# Patient Record
Sex: Female | Born: 1955
Health system: Southern US, Community
[De-identification: ages and names within clinical notes are randomized; demographics above are authoritative.]

## PROBLEM LIST (undated history)

## (undated) DIAGNOSIS — S82899A Other fracture of unspecified lower leg, initial encounter for closed fracture: Secondary | ICD-10-CM

## (undated) DIAGNOSIS — E782 Mixed hyperlipidemia: Secondary | ICD-10-CM

## (undated) DIAGNOSIS — M1990 Unspecified osteoarthritis, unspecified site: Secondary | ICD-10-CM

## (undated) DIAGNOSIS — C801 Malignant (primary) neoplasm, unspecified: Secondary | ICD-10-CM

## (undated) DIAGNOSIS — M7072 Other bursitis of hip, left hip: Secondary | ICD-10-CM

## (undated) DIAGNOSIS — E039 Hypothyroidism, unspecified: Secondary | ICD-10-CM

## (undated) DIAGNOSIS — N390 Urinary tract infection, site not specified: Secondary | ICD-10-CM

## (undated) DIAGNOSIS — S62132A Displaced fracture of capitate [os magnum] bone, left wrist, initial encounter for closed fracture: Secondary | ICD-10-CM

## (undated) DIAGNOSIS — IMO0002 Reserved for concepts with insufficient information to code with codable children: Secondary | ICD-10-CM

## (undated) DIAGNOSIS — R7303 Prediabetes: Secondary | ICD-10-CM

## (undated) DIAGNOSIS — I10 Essential (primary) hypertension: Secondary | ICD-10-CM

## (undated) DIAGNOSIS — Z87442 Personal history of urinary calculi: Secondary | ICD-10-CM

## (undated) DIAGNOSIS — K219 Gastro-esophageal reflux disease without esophagitis: Secondary | ICD-10-CM

## (undated) DIAGNOSIS — L03313 Cellulitis of chest wall: Secondary | ICD-10-CM

## (undated) DIAGNOSIS — M5090 Cervical disc disorder, unspecified, unspecified cervical region: Secondary | ICD-10-CM

## (undated) HISTORY — PX: BACK SURGERY: SHX140

## (undated) HISTORY — PX: TUBAL LIGATION: SHX77

## (undated) HISTORY — PX: GYNECOLOGIC CRYOSURGERY: SHX857

## (undated) HISTORY — PX: OTHER SURGICAL HISTORY: SHX169

---

## 2002-04-18 ENCOUNTER — Emergency Department (HOSPITAL_COMMUNITY): Admission: EM | Admit: 2002-04-18 | Discharge: 2002-04-18 | Payer: Self-pay | Admitting: *Deleted

## 2002-04-19 ENCOUNTER — Ambulatory Visit (HOSPITAL_COMMUNITY): Admission: RE | Admit: 2002-04-19 | Discharge: 2002-04-19 | Payer: Self-pay | Admitting: *Deleted

## 2002-04-19 ENCOUNTER — Encounter: Payer: Self-pay | Admitting: *Deleted

## 2002-12-30 ENCOUNTER — Emergency Department (HOSPITAL_COMMUNITY): Admission: AD | Admit: 2002-12-30 | Discharge: 2002-12-30 | Payer: Self-pay | Admitting: Emergency Medicine

## 2002-12-30 ENCOUNTER — Encounter: Payer: Self-pay | Admitting: Emergency Medicine

## 2003-09-25 HISTORY — PX: LUMBAR FUSION: SHX111

## 2004-09-24 HISTORY — PX: COLONOSCOPY: SHX174

## 2004-11-30 ENCOUNTER — Ambulatory Visit: Payer: Self-pay | Admitting: Internal Medicine

## 2005-05-25 ENCOUNTER — Inpatient Hospital Stay (HOSPITAL_COMMUNITY): Admission: RE | Admit: 2005-05-25 | Discharge: 2005-05-29 | Payer: Self-pay | Admitting: Orthopaedic Surgery

## 2005-09-24 HISTORY — PX: LUMBAR FUSION: SHX111

## 2005-11-04 ENCOUNTER — Encounter: Admission: RE | Admit: 2005-11-04 | Discharge: 2005-11-04 | Payer: Self-pay | Admitting: Orthopaedic Surgery

## 2005-11-26 ENCOUNTER — Encounter: Admission: RE | Admit: 2005-11-26 | Discharge: 2005-11-26 | Payer: Self-pay | Admitting: Orthopaedic Surgery

## 2006-01-28 ENCOUNTER — Encounter: Admission: RE | Admit: 2006-01-28 | Discharge: 2006-01-28 | Payer: Self-pay | Admitting: Orthopaedic Surgery

## 2006-03-12 ENCOUNTER — Ambulatory Visit (HOSPITAL_BASED_OUTPATIENT_CLINIC_OR_DEPARTMENT_OTHER): Admission: RE | Admit: 2006-03-12 | Discharge: 2006-03-12 | Payer: Self-pay | Admitting: Orthopaedic Surgery

## 2006-10-03 ENCOUNTER — Encounter: Admission: RE | Admit: 2006-10-03 | Discharge: 2006-10-03 | Payer: Self-pay | Admitting: Orthopaedic Surgery

## 2006-11-29 ENCOUNTER — Encounter: Admission: RE | Admit: 2006-11-29 | Discharge: 2006-11-29 | Payer: Self-pay | Admitting: Orthopaedic Surgery

## 2006-12-11 ENCOUNTER — Inpatient Hospital Stay (HOSPITAL_COMMUNITY): Admission: RE | Admit: 2006-12-11 | Discharge: 2006-12-14 | Payer: Self-pay | Admitting: Orthopaedic Surgery

## 2008-10-20 ENCOUNTER — Encounter: Admission: RE | Admit: 2008-10-20 | Discharge: 2008-10-20 | Payer: Self-pay | Admitting: Orthopaedic Surgery

## 2009-10-30 ENCOUNTER — Emergency Department: Payer: Self-pay | Admitting: Internal Medicine

## 2009-11-07 ENCOUNTER — Ambulatory Visit: Payer: Self-pay | Admitting: Urology

## 2010-08-24 HISTORY — PX: KNEE ARTHROSCOPY: SHX127

## 2011-02-09 NOTE — Discharge Summary (Signed)
NAMEKRYSTAN, NORTHROP              ACCOUNT NO.:  [id number]   MEDICAL RECORD NO.:  [id number]          PATIENT TYPE:  INP   LOCATION:  5018                         FACILITY:  [facility]   PHYSICIAN:  Sharolyn Douglas, M.D.        DATE OF BIRTH:  [birth year]   DATE OF ADMISSION:  12/11/2006  DATE OF DISCHARGE:  12/14/2006                               DISCHARGE SUMMARY   ADMITTING DIAGNOSIS:  1. Adjacent segment disease at L5-1 with severe back pain.  2. Previous posterior spinal fusion.   DISCHARGE DIAGNOSIS:  1. Status post revision posterior spinal fusion L5-S1.  2. Postoperative blood loss anemia.  3. Postoperative hyperglycemia.  4. Urinary tract infection.   PROCEDURE:  On 12/11/06 the patient was admitted and taken to the  operating room for removal of hardware and exploration of fusion L4-5  and posterior spinal fusion L5-S1 with pedicle screws  TLIF.  Surgeon  was Sharolyn Douglas, M.D.  Stage manager Mahar PAC.  Anesthesia was  general.   CONSULTATION:  None.   LABS:  Preop CBC with diff was normal.  Postoperatively H&H were  monitored x3 days and reached 11.6 and 33.4 on 12/14/06.  Coags were  normal preop.  Complete metabolic panel preop was normal with the  exception of ALT of 47.  Basic metabolic panel monitored x2 days, showed  a glucose of 117 and 123 on postop day one and two respectively.  BUN of  4 and 2 on postoperative day one and two.  UA from 03/17 showed positive  nitrites, few epithelials, 3 to 6 WBC's and many bacteria.  E. coli grew  out on the cultures.  EKG from March 17 shows normal sinus rhythm, no  change since last tracing read by Jaclyn Prime. Lucas Mallow, M.D.  X-rays from  03/22 showed stable postoperative changes from L-5 to S-1.  On 03/19 x-  ray was used intraoperatively for localization and screw placement.   BRIEF HISTORY:  The patient is a 55 year old female who has been a  patient of Dr. Noel Gerold for several years now.  She has undergone a  previous L4-5  posterior spinal fusion and did very well for a short  period time.  Unfortunately she developed adjacent segment disease and  pain related to the L5-S1 segment.  Because of her failure to improve  with conservative treatment and her increasing pain, Dr. Noel Gerold felt the  best course of management would be a revision posterior spinal fusion,  evaluate the fusion L4-5, remove the hardware and then fuse L5-S1.  Risks and benefits of the procedure were discussed with the patient by  Dr. Noel Gerold.  She indicated understanding and opted to proceed.   HOSPITAL COURSE:  On 12/11/06 the patient was admitted to the hospital  and taken to the operating room for the above-listed procedure.  She  tolerated procedure well without any intraoperative complications.  She  was transferred to recovery room in stable condition.   Routine orthopedic spine protocol was followed postoperatively and she  progressed along very well.  She did not develop any significant medical  or  orthopedic complications postoperatively.   Physical therapy and occupational therapy worked with the patient on a  daily basis on progressive ambulation program.  They also worked with  her on a brace use and back precautions.  She progressed along very well  with them and got to the point that she was independent and safe prior  to her discharge.   The patient was noted to have a urinary tract infection on her  preoperative UA.  She was started on Cipro and actually discharged on  Cipro from the hospital.   By 12/14/06 the patient had met all orthopedic goals.  She was medically  stable and ready for discharge home.   DISCHARGE/PLAN:  Patient is a 55 year old female status post revision  posterior spinal fusion doing well.   ACTIVITIES:  Daily ambulation program, brace on when she is up, back  precautions at all times, no lifting greater than 5 pounds, the patient  may shower.   FOLLOW UP:  Follow-up 2 weeks postoperatively with  Dr. Noel Gerold.   DIET:  Regular home diet as tolerated.   MEDICATIONS ON DISCHARGE:  Vicodin or Tramadol for pain, Skelaxin for  muscle spasm, Colace twice daily, laxative as needed, multivitamin  daily, calcium daily, avoid NSAIDs.   DISPOSITION:  The patient is being discharged to her home with her  family assistance as well as home health physical therapy and  occupational therapy.   CONDITION ON DISCHARGE:  Stable, improved.      Verlin Fester, P.A.      Sharolyn Douglas, M.D.  Electronically Signed    CM/MEDQ  D:  02/20/2007  T:  02/20/2007  Job:  [id number]   cc:   Sharolyn Douglas, M.D.

## 2011-02-09 NOTE — Discharge Summary (Signed)
NAMEDABNEY, DEVER              ACCOUNT NO.:  [id number]   MEDICAL RECORD NO.:  [id number]          PATIENT TYPE:  INP   LOCATION:  5018                         FACILITY:  [facility]   PHYSICIAN:  Sharolyn Douglas, M.D.        DATE OF BIRTH:  Oct 06, 1955   DATE OF ADMISSION:  05/24/2005  DATE OF DISCHARGE:  05/29/2005                                 DISCHARGE SUMMARY   ADMISSION DIAGNOSIS:  L4-5 spondylolisthesis with spinal stenosis.   DISCHARGE DIAGNOSES:  1.  Status post L4-5 posterior spinal fusion with pedicle screws and T-lift.  2.  Postoperative blood loss anemia, stable.  3.  Postoperative hyperglycemia.   PROCEDURE:  On May 24, 2005 the patient was taken to the operating room  for an L4-5 posterior spinal fusion with pedicle screws and T-lift.  This  was done by Sharolyn Douglas, M.D. and [facility], P.A.-C. for assistance.  Anesthesia was general.   CONSULTATIONS:  None.   LABORATORY DATA:  Preoperative CBC with differential, PT INR, complete  metabolic panel and UA were all negative with the exception of UA having few  epithelial, 21-50 wbc's and a few bacteria.  Postoperatively H&H was  monitored x3 days and reached a low of 11.1 and 32.0 on May 27, 2005.  She was stable and did not require any treatment.  Recent metabolic panel  from the first two postoperative days was within normal limits with the  exception of a glucose slightly elevated at 108 and 127.  Blood type from  May 21, 2005 type A, Rh positive, antibody screen negative.  X-rays:  Chest x-ray perioperatively showed no active lung disease.  __________  intraoperatively for localization showed a posterior fusion at L4-5.  There  is no EKG seen on the chart.   HISTORY OF PRESENT ILLNESS:  The patient is a 55 year old lady who has been  having progressive problems related to her back with radiation into her  lower extremities, left being worse than the right.  She has tried numerous  methods of conservative  treatment unfortunately without any lasting relief  of her symptoms.  Because of the severity of her symptoms as well as her x-  ray and MRI findings it was thought her best course of management would be  posterior spinal fusion and decompression procedure.  Risks and benefits of  this were discussed with the patient at length by Dr. Noel Gerold and indicated  understanding of the procedure and wants to proceed with surgery.   HOSPITAL COURSE:  On May 24, 2005 the patient was taken to the operating  room for the above procedure and tolerated the procedure well without any  intraoperative complications and was transferred to the recovery room in  stable condition.  There was about 150 mL of blood loss intraoperatively.   Postoperatively, routine orthopedic spinal protocol was followed and she did  progress along extremely well, without developing any significant  postoperative complications.   Physical therapy and occupational therapy did work with her on a daily basis  on safety, brace use and back precautions.  She did get to  the point that  she was independent with her brace and safe ambulating assisted.   By June 01, 2005 the patient had met orthopedic goals and medically she  was stable and ready.  Her home needs had been arranged.  Home health  physical therapy was arranged to assist her on discharge.   DISCHARGE PLANS:  The patient is a 55 year old female status post above  spinal fusion doing well.  Activity is daily ambulation.  She should have a  brace on when she is on her feet.  Back precautions at all times.  Avoid  lifting heavier than five pounds.  Dressing changed daily.  Keep the  incision dry until all drainage stops at which time she may begin showering.  Followup in two weeks postoperatively with Dr. Noel Gerold, call for an  appointment.   DISCHARGE MEDICATIONS:  1.  Vicodin p.r.n. pain.  2.  Robaxin p.r.n. muscle spasm.  3.  Multivitamins daily.  4.  Calcium 1200  mg daily.  5.  Colace 100 mg twice daily.  6.  Laxative as needed.   CONDITION ON DISCHARGE:  Stable and improved.   DISPOSITION:  The patient being discharged to her home with the planned  assistance as well as home health physical therapy and occupational therapy.      Verlin Fester, P.A.      Sharolyn Douglas, M.D.  Electronically Signed    CM/MEDQ  D:  08/22/2005  T:  08/22/2005  Job:  [id number]

## 2011-02-09 NOTE — Op Note (Signed)
NAME:  Monica Tyler, Monica Tyler              ACCOUNT NO.:  [id number]   MEDICAL RECORD NO.:  [id number]          PATIENT TYPE:  AMB   LOCATION:  SDS                          FACILITY:  [facility]   PHYSICIAN:  Sharolyn Douglas, M.D.        DATE OF BIRTH:  1956-02-02   DATE OF PROCEDURE:  05/24/2005  DATE OF DISCHARGE:                                 OPERATIVE REPORT   DIAGNOSIS:  L4-5 degenerative spondylolisthesis and lateral recess stenosis.   PROCEDURE:  1.  L4-5 laminectomy with decompression of the left lateral recess and L4-5      foramen.  2.  Posterior spinal arthrodesis L4-5.  3.  TLIF L4-5 with placement of peak cage.  4.  Pedicle screw instrumentation L4-5 using the Abbott spine system.  5.  Local autogenous bone graft supplemented with Grafton allograft.  6.  Bone marrow aspiration.   SURGEON:  Sharolyn Douglas, M.D.   ASSISTANT:  Verlin Fester, P.A.   ANESTHESIA:  General endotracheal.   COMPLICATIONS:  None.   INDICATIONS:  The patient is a 55 year old pleasant female with chronic  disabling back and left lower extremity pain felt to be secondary to  degenerative spondylolisthesis and lateral recess narrowing. She has failed  to respond to conservative measures, elected to undergo L4-5 decompression  and fusion in hopes of improving her symptoms. Risks and benefits were  reviewed.   PROCEDURE:  The patient was identified in the holding area and taken to the  operating room, underwent general endotracheal anesthesia without  difficulty, given prophylactic IV antibiotics. Carefully positioned prone on  the Wilson frame. All bony prominences were padded. Face and eyes were  protected at all times. The back was prepped and draped in the usual sterile  fashion. The midline incision was made over the L3-L5 level. Dissection was  carried sharply through the deep fascia. The paraspinal muscles were  elevated out to the tips of the L4 and L5 transverse processes. The L4-5  facet joints were  found to be partially subluxated and there were large  facet cysts noted. Deep retractors were placed. Intraoperative x-ray taken  to confirm location. Pedicle screws were placed at L4 and L5 bilaterally  using anatomic probing technique. The pedicle holes were palpated and there  were no breeches. We utilized 6.5 x 15 mm screws at L4, 6.5 x 45 mm screws  at L5. The screws were stimulated using triggered EMG's and there were no  deleterious changes. We then turned our attention to completing a  laminectomy at L4-5 by removing the interspinous ligament between L4 and L5  and the inferior two-thirds of the L4 spinous process. Laminectomy was  carried up along the right side removing the ligamentum flavum piecemeal.  The lateral recess and foramen were decompressed. At this point in the  procedure, it was elected to proceed with a TLIF in order to indirectly  decompress the canal further by reducing the spondylolisthesis and also to  improve the fusion rate. We therefore osteotomized the facet joint to the  pars interarticularis. The residual superior facet was then removed. The  exiting and traversing nerve roots were identified and protected at all  times. Free running EMG's were monitored. Sharp annulotomy was completed.  The disk space was then distracted up to 13 mm. Curets were used to scrape  the cartilaginous endplates and complete a radical diskectomy across the  contralateral side. The disk space was packed with local autogenous bone  graft. A 13 mm peak cage was then packed with local autogenous bone graft  along with bone marrow which had been aspirated from the patient's pedicles  during screw placement. The graft was carefully inserted, tamped anteriorly  and across the midline. The free running EMG's were monitored. There were no  changes during insertion of the graft. We then placed 50 mm titanium rods  into the polyaxial screw heads and applied compression across the segment   before shearing off the locking caps. The remaining bone graft along with  the allograft was packed into the lateral gutters. This again was mixed with  bone marrow which was aspirated from the patient's pedicles. A deep Hemovac  drain left in place. Bleeding was controlled. Deep fascia closed with a  running #1 Vicryl suture. The subcutaneous layer closed with 0 Vicryl  followed by 2-0 Vicryl followed by a running 3-0 subcuticular Vicryl suture  on the skin edges. Benzoin and Steri-Strips were placed. Sterile dressing  applied. The sponge and needle count was correct. Estimated blood loss was  150 cc.      Sharolyn Douglas, M.D.  Electronically Signed     MC/MEDQ  D:  05/24/2005  T:  05/24/2005  Job:  [id number]

## 2011-02-09 NOTE — Op Note (Signed)
NAMEJAMEL, Monica Tyler              ACCOUNT NO.:  [id number]   MEDICAL RECORD NO.:  [id number]          PATIENT TYPE:  INP   LOCATION:  5018                         FACILITY:  [facility]   PHYSICIAN:  Sharolyn Douglas, M.D.        DATE OF BIRTH:  [birth year]   DATE OF PROCEDURE:  12/11/2006  DATE OF DISCHARGE:                               OPERATIVE REPORT   DIAGNOSIS:  1. Adjacent segment spondylosis L5-S1 below previous L4-L5      instrumented fusion.  2. Post laminectomy syndrome  3. Status post instrumented L4-L5 fusion.   PROCEDURE:  1. Exploration of L4-L5 fusion with removal of pedicle screw      instrumentation.  2. L5-S1 revision laminectomy with decompression of the right L5-S1      nerve roots.  3. L5-S1 posterior spinal fusion.  4. L5-S1 transforaminal lumbar interbody fusion with placement of 11      mm PEEK cage.  5. Pedicle screw instrumentation L5-S1 using the Abbott spine system.  6. Local autogenous bone graft supplemented with BMP.   SURGEON:  Sharolyn Douglas, M.D.   ASSISTANTJill Side Mahar, P.A.-C.   ANESTHESIA:  General endotracheal.   ESTIMATED BLOOD LOSS:  100 mL.   COMPLICATIONS:  None.   COUNTS:  Instrument and sponge count correct.   INDICATIONS:  The patient is a pleasant 55 year old female who has had  previous L5-S1 decompression and fusion.  She did well after the  operation for several years with improvement of her back pain.  Unfortunately, she has redeveloped symptoms.  She had temporary relief  with facet injections done at L5-S1.  She now presents for extension of  fusion across the L5-S1 interspace in hopes of improving her symptoms  which have been refractory to all conservative treatment.  The risks,  benefits, and alternatives were reviewed.   DESCRIPTION OF PROCEDURE:  After informed consent, she was taken to the  operating room.  She underwent general endotracheal anesthesia without  difficulty and given prophylactic IV antibiotics.  Neural  monitoring was  established in the form of SSEPs and lower extremity EMGs.  She was  carefully turned prone onto the Wilson frame.  All bony prominences were  padded.  The face and eyes were protected at all times.  The back was  prepped and draped in the usual sterile fashion.  The previous midline  incision was utilized.  Dissection was carried through the scar exposing  the instrumentation at L4-L5.  Further exposure of the L5-S1 joint was  then carried out.  Deep retractors were placed.   The appropriate screwdrivers was then used to remove the locking caps  and the rods.  The pedicle screws were evaluated and were well fixed.  These were then backed out using the appropriate screwdriver.  Further  exposure of the posterior lateral fusion was carried out.  This appeared  to be solid on the left side.  It was partially healed on the right.  There did not appear to be any movement and it was felt that it was a  solid interbody fusion at L4-L5.  We then turned our attention to exposing the transverse process of L5  and the sacral ala bilaterally in preparation for the arthrodesis.  We  prepared pedicle screw holes at S1 bilaterally.  We placed 7.5 x 30 mm  screws at S1 bilaterally using anatomic probing technique.  The pedicle  holes were palpated and there were no breeches.  We placed 6.5 x 45 mm  screws in the L5 pedicle, again, with good screw purchase.  Each screw  was stimulated using triggered EMGs and there were no deleterious  changes. Before placing the screws, we had decorticated the transverse  processes and sacral ala in preparation for the arthrodesis.   We then turned our attention to performing a revision laminectomy at L5-  S1. The previous laminotomy and scar tissue was carefully defined using  curets.  We then entered the spinal canal and widened the laminectomy  removing the entire right L5 hemilamina.  The ligamentum flavum was  removed piecemeal.  The thecal sac  was identified.  The L5 and S1 nerve  roots were widely decompressed.  We then turned our attention to  performing a transforaminal lumbar interbody fusion.  The remaining  facet joint was osteotomized at L5-S1 on the right.  The exiting  transversing nerve roots were identified.  Free running EMGs were  monitored.  The disc space was entered and a radical discectomy was  completed across the contralateral side.  The disc space was dilated up  to 11 mm using intervertebral distractors.  We then packed the disc  space with local bone graft obtained from the laminectomy along with BMP  sponges.  An 11 mm PEEK cage was packed with a BMP sponge and inserted  in the interspace, tamped anteriorly and across the midline.  We  completed the posterior spinal fusion by packing the remaining local  bone graft from the laminectomy into the lateral gutters between L5 and  S1.  Short segment titanium rods were placed into the polyaxial screw  heads.  Compression was applied before shearing off the locking caps.   Gelfoam was left over the exposed epidural space.  Hemostasis was  achieved.  A deep Hemovac drain was left.  The deep fascia was closed  with a running #1 Vicryl suture, the subcutaneous layer closed with 0  Vicryl and 2-0 Vicryl, followed by a running 3-0 subcuticular Vicryl  suture on the skin edges.  Dermabond was applied.  A sterile dressing  was placed.  The patient was turned supine, extubated without  difficulty, and transferred to recovery in stable condition  neurologically intact.   It should be noted that my assistant, Verlin Fester, P.A.-C., was  present throughout the procedure including positioning, exposure,  decompression, removal of the instrumentation, pedicle screws, TLIF, and  wound closure.      Sharolyn Douglas, M.D.  Electronically Signed     MC/MEDQ  D:  12/11/2006  T:  12/11/2006  Job:  [id number]

## 2011-02-09 NOTE — H&P (Signed)
NAME:  EILIS, CHESTNUTT              ACCOUNT NO.:  [id number]   MEDICAL RECORD NO.:  [id number]          PATIENT TYPE:  AMB   LOCATION:                               FACILITY:  [facility]   PHYSICIAN:  Dooley L. Underwood, P.A.DATE OF BIRTH:  06/17/1956   DATE OF ADMISSION:  05/24/2005  DATE OF DISCHARGE:                                HISTORY & PHYSICAL   CHIEF COMPLAINT:  Pain in my back and legs, more so on the left.   HISTORY OF PRESENT ILLNESS:  This is a 55 year old lady who has been seen by  [place] for continued progressive problems concerning pain into the lumbar spine  with radiation into the lower extremities, more so on the left than the  right.  She has been treated conservatively in our practice by Dr. Charlann Boxer and  Dr. Ethelene Hal but, unfortunately, is continuing with pain and discomfort with  radiation into the left buttock and leg in the L5 distribution.  She has  burning, numbness and tingling along this area as well.  Standing or walking  increases her pain and discomfort even though she is trying to continue with  an exercise program of a walking nature.  Analgesics provide her with short  relief, but her pain soon returns to the point where it is markedly  interfering with her day-to-day activity.  She is trying to stay employed  but, unfortunately, this present illness is markedly interfering with her  day-to-day activities.  She has pain on extension of the lumbar spine.  Reflexes are equal.  Babinski, clonus and Hoffman are negative, and she has  a negative straight leg raise bilaterally.  MRI has shown an L4-5 bilateral  facet hypertrophy with a grade 1 listhesis.  She also has lateral recess  narrowing at the same level.  After much discussion, including the risks and  benefits of surgery, it was felt the patient would benefit with surgical  intervention and is being admitted for an L4-5 posterior spinal fusion.   The patient has been cleared preoperatively by Dr. Letitia Libra of  [facility] in [place], [place].  Today the patient is  fitted with an EBI lumbosacral orthosis, which will be used postoperatively.  This history and physical is performed in our office on May 11, 2005.   PAST MEDICAL HISTORY:  The patient is allergic to PENICILLIN.   Her only medications at the current time are analgesics.   She had renal calculi about 10 years ago.  Did not list any surgeries.   FAMILY HISTORY:  Positive for diabetes in her mother and brother and a  stroke in her mother and father.   SOCIAL HISTORY:  The patient is married.  She is a weaver.  Has no intake of  alcohol or tobacco products.  Has two children, and her husband, Molly Maduro,  will be major caregiver after surgery.   REVIEW OF SYSTEMS:  CENTRAL NERVOUS SYSTEM:  No seizure disorder, paralysis,  double vision, but the patient does have radiculitis as mentioned above in  the L5 nerve root in the left lower  extremity.  CARDIOVASCULAR:  No chest  pain, no angina, no orthopnea.  RESPIRATORY:  No productive cough, no  hemoptysis, no shortness of breath.  GASTROINTESTINAL:  No nausea, no  vomiting, and no bloody stools.  GENITOURINARY:  No discharge, dysuria or  hematuria.  MUSCULOSKELETAL:  Primarily in the present illness.   PHYSICAL EXAMINATION:  GENERAL:  Alert, cooperative, friendly 55 year old  white female.  VITAL SIGNS:  Blood pressure 140/66, pulse 72, respirations 12.  HEENT:  Normocephalic.  PERRLA.  Oropharynx is clear.  CHEST:  Clear to auscultation, no rhonchi, no rales.  CARDIAC:  Regular rate and rhythm, no murmurs are heard.  ABDOMEN:  Soft, nontender, liver and spleen not felt.  GENITALIA, RECTAL, PELVIC, BREASTS:  Not done, not pertinent to present  illness.  EXTREMITIES:  As in present illness above.   ADMISSION DIAGNOSIS:  L4-5 spondylolisthesis with stenosis.   PLAN:  The patient will undergo L4-5 posterior spinal fusion with  laminectomy utilizing  pedicle screws, transforaminal lumbar interbody fusion  with allograft and bone morphogenic protein.  She will use her EBI after  surgery.      Dooley L. Cherlynn June.     DLU/MEDQ  D:  05/12/2005  T:  05/12/2005  Job:  [id number]   cc:   Letitia Libra, M.D.  [facility]  [phone]

## 2011-02-09 NOTE — Op Note (Signed)
NAME:  Monica Tyler, Monica Tyler              ACCOUNT NO.:  [id number]   MEDICAL RECORD NO.:  [id number]          PATIENT TYPE:  AMB   LOCATION:  [facility]                         FACILITY:  [facility]   PHYSICIAN:  Sharolyn Douglas, M.D.        DATE OF BIRTH:  12-28-1955   DATE OF PROCEDURE:  03/12/2006  DATE OF DISCHARGE:                                 OPERATIVE REPORT   PREOPERATIVE DIAGNOSIS:  L5-S1 facette disease, below previous L4-5 fusion.   POSTOPERATIVE DIAGNOSIS:  L5-S1 facette disease, below previous L4-5 fusion.   PROCEDURE:  Bilateral L5-S1 facette injections and radiographic fluoroscopic  imaging used for needle placement.   SURGEON:  Sharolyn Douglas, M.D.   ASSISTANT:  None.   ANESTHESIA:  MAC plus local.   COMPLICATIONS:  None.   ESTIMATED BLOOD LOSS:  None.   INDICATIONS:  The patient is a pleasant, 55 year old female with persistent  lower back pain status post L4-5 fusion.  She now presents for diagnostic  and possible therapeutic L5-S1 facette injections, bilaterally.  Risks and  benefits were reviewed.   DESCRIPTION OF PROCEDURE:  She was identified in the holding area, taken to  the operating room, and underwent sedation. She was turned prone, and the  back prepped and draped in the usual sterile fashion.  Fluoroscopy brought  out onto the field.  The L5-S1 facette joint was visualized.  A 22-gauge  Quincke needle advanced into the L5-S1 facette joint bilaterally under  fluoroscopic imaging.  Then 1 mL of Omnipaque injected, confirmed intra-  articular spread.  We then injected a solution of 40 mg of Depo-Medrol in 3  mL preservative-free lidocaine into each facette joint at L5-S1.   The needles were removed.  Band-aids placed, turned prone, and transferred  to recovery in stable condition.  Post injection instructions reviewed.  Followup in 2 weeks.      Sharolyn Douglas, M.D.  Electronically Signed     MC/MEDQ  D:  03/12/2006  T:  03/12/2006  Job:  [id number]

## 2011-10-03 ENCOUNTER — Other Ambulatory Visit (HOSPITAL_COMMUNITY): Payer: Self-pay | Admitting: Orthopaedic Surgery

## 2011-12-07 ENCOUNTER — Encounter (HOSPITAL_COMMUNITY): Payer: Self-pay | Admitting: Pharmacy Technician

## 2011-12-12 ENCOUNTER — Ambulatory Visit (HOSPITAL_COMMUNITY)
Admission: RE | Admit: 2011-12-12 | Discharge: 2011-12-12 | Disposition: A | Discharge status: Home / Self Care | Payer: Medicare Other | Source: Ambulatory Visit | Attending: Orthopaedic Surgery | Admitting: Orthopaedic Surgery

## 2011-12-12 ENCOUNTER — Other Ambulatory Visit: Payer: Self-pay

## 2011-12-12 ENCOUNTER — Encounter (HOSPITAL_COMMUNITY)
Admission: RE | Admit: 2011-12-12 | Discharge: 2011-12-12 | Disposition: A | Discharge status: Home / Self Care | Payer: Medicare Other | Source: Ambulatory Visit | Attending: Orthopaedic Surgery | Admitting: Orthopaedic Surgery

## 2011-12-12 ENCOUNTER — Encounter (HOSPITAL_COMMUNITY): Payer: Self-pay

## 2011-12-12 DIAGNOSIS — Z01818 Encounter for other preprocedural examination: Secondary | ICD-10-CM | POA: Insufficient documentation

## 2011-12-12 DIAGNOSIS — Z01812 Encounter for preprocedural laboratory examination: Secondary | ICD-10-CM | POA: Insufficient documentation

## 2011-12-12 DIAGNOSIS — Z01810 Encounter for preprocedural cardiovascular examination: Secondary | ICD-10-CM | POA: Insufficient documentation

## 2011-12-12 DIAGNOSIS — R9431 Abnormal electrocardiogram [ECG] [EKG]: Secondary | ICD-10-CM | POA: Insufficient documentation

## 2011-12-12 HISTORY — DX: Reserved for concepts with insufficient information to code with codable children: IMO0002

## 2011-12-12 HISTORY — DX: Malignant (primary) neoplasm, unspecified: C80.1

## 2011-12-12 HISTORY — DX: Essential (primary) hypertension: I10

## 2011-12-12 HISTORY — DX: Unspecified osteoarthritis, unspecified site: M19.90

## 2011-12-12 LAB — CBC
MCV: 96.9 fL (ref 78.0–100.0)
WBC: 6.1 10*3/uL (ref 4.0–10.5)

## 2011-12-12 LAB — BASIC METABOLIC PANEL
BUN: 17 mg/dL (ref 6–23)
CO2: 28 mEq/L (ref 19–32)
Creatinine, Ser: 0.76 mg/dL (ref 0.50–1.10)
GFR calc non Af Amer: >90 mL/min (ref >90)
Potassium: 3.7 mEq/L (ref 3.5–5.1)
Sodium: 141 mEq/L (ref 135–145)

## 2011-12-12 LAB — APTT: aPTT: 30 seconds (ref 24–37)

## 2011-12-12 LAB — URINE MICROSCOPIC-ADD ON

## 2011-12-12 LAB — SURGICAL PCR SCREEN
MRSA, PCR: NEGATIVE
Staphylococcus aureus: NEGATIVE

## 2011-12-12 LAB — URINALYSIS, ROUTINE W REFLEX MICROSCOPIC
Bilirubin Urine: NEGATIVE
Glucose, UA: NEGATIVE mg/dL
Hgb urine dipstick: NEGATIVE

## 2011-12-12 NOTE — Pre-Procedure Instructions (Signed)
PREOP EKG DONE TODAY AT [facility] AND OLD EKG REPORT 01/06/10 FROM DR. JOHN GRIFFIN;S OFFICE SHOWN TO DR. EWELL AND DR. EWELL SAYS PREOP EKG IS OK.

## 2011-12-12 NOTE — Pre-Procedure Instructions (Signed)
EKG & CXR, CBC, BMET, UA, PT, PTT WERE DONE TODAY PREOP--T/S IS TO BE DRAWN THE DAY OF SURGERY

## 2011-12-12 NOTE — Patient Instructions (Signed)
YOUR SURGERY IS SCHEDULED ON:  Friday 3/29  AT 9:30 AM  REPORT TO [facility]AY [facility] AT:  7:30 AM      PHONE # FOR SHORT STAY IS [phone]  DO NOT EAT OR DRINK ANYTHING AFTER MIDNIGHT THE NIGHT BEFORE YOUR SURGERY.  YOU MAY BRUSH YOUR TEETH, RINSE OUT YOUR MOUTH--BUT NO WATER, NO FOOD, NO CHEWING GUM, NO MINTS, NO CANDIES, NO CHEWING TOBACCO.  PLEASE TAKE THE FOLLOWING MEDICATIONS THE AM OF YOUR SURGERY WITH A FEW SIPS OF WATER:  HYDROCODONE / ACETAMINOPHEN    IF YOU USE INHALERS--USE YOUR INHALERS THE AM OF YOUR SURGERY AND BRING INHALERS TO THE HOSPITAL -TAKE TO SURGERY.    IF YOU ARE DIABETIC:  DO NOT TAKE ANY DIABETIC MEDICATIONS THE AM OF YOUR SURGERY.  IF YOU TAKE INSULIN IN THE EVENINGS--PLEASE ONLY TAKE 1/2 NORMAL EVENING DOSE THE NIGHT BEFORE YOUR SURGERY.  NO INSULIN THE AM OF YOUR SURGERY.  IF YOU HAVE SLEEP APNEA AND USE CPAP OR BIPAP--PLEASE BRING THE MASK --NOT THE MACHINE-NOT THE TUBING   -JUST THE MASK. DO NOT BRING VALUABLES, MONEY, CREDIT CARDS.  CONTACT LENS, DENTURES / PARTIALS, GLASSES SHOULD NOT BE WORN TO SURGERY AND IN MOST CASES-HEARING AIDS WILL NEED TO BE REMOVED.  BRING YOUR GLASSES CASE, ANY EQUIPMENT NEEDED FOR YOUR CONTACT LENS. FOR PATIENTS ADMITTED TO THE HOSPITAL--CHECK OUT TIME THE DAY OF DISCHARGE IS 11:00 AM.  ALL INPATIENT ROOMS ARE PRIVATE - WITH BATHROOM, TELEPHONE, TELEVISION AND WIFI INTERNET. IF YOU ARE BEING DISCHARGED THE SAME DAY OF YOUR SURGERY--YOU CAN NOT DRIVE YOURSELF HOME--AND SHOULD NOT GO HOME ALONE BY TAXI OR BUS.  NO DRIVING OR OPERATING MACHINERY FOR 24 HOURS FOLLOWING ANESTHESIA / PAIN MEDICATIONS.                            SPECIAL INSTRUCTIONS:  CHLORHEXIDINE SOAP SHOWER (other brand names are Betasept and Hibiclens ) PLEASE SHOWER WITH CHLORHEXIDINE THE NIGHT BEFORE YOUR SURGERY AND THE AM OF YOUR SURGERY. DO NOT USE CHLORHEXIDINE ON YOUR FACE OR PRIVATE AREAS--YOU MAY USE YOUR NORMAL SOAP THOSE AREAS AND YOUR NORMAL  SHAMPOO.  WOMEN SHOULD AVOID SHAVING UNDER ARMS AND SHAVING LEGS 48 HOURS BEFORE USING CHLORHEXIDINE TO AVOID SKIN IRRITATION.  DO NOT USE IF ALLERGIC TO CHLORHEXIDINE.  PLEASE READ OVER ANY  FACT SHEETS THAT YOU WERE GIVEN: MRSA INFORMATION, BLOOD TRANSFUSION INFORMATION.

## 2011-12-12 NOTE — Pre-Procedure Instructions (Signed)
SHERRIE AT DR. Alben Spittle OFFICE NOTIFIED OF PT'S ABNORMAL UA REPORT--SHE WILL LET DR. BLACKMAN KNOW.

## 2011-12-15 DIAGNOSIS — N390 Urinary tract infection, site not specified: Secondary | ICD-10-CM

## 2011-12-15 HISTORY — DX: Urinary tract infection, site not specified: N39.0

## 2011-12-21 ENCOUNTER — Encounter (HOSPITAL_COMMUNITY)
Admission: RE | Disposition: A | Discharge status: Home Health | Payer: Self-pay | Source: Ambulatory Visit | Attending: Orthopaedic Surgery

## 2011-12-21 ENCOUNTER — Inpatient Hospital Stay (HOSPITAL_COMMUNITY)
Admission: RE | Admit: 2011-12-21 | Discharge: 2011-12-24 | DRG: 470 | Disposition: A | Discharge status: Home Health | Payer: Medicare Other | Source: Ambulatory Visit | Attending: Orthopaedic Surgery | Admitting: Orthopaedic Surgery

## 2011-12-21 ENCOUNTER — Encounter (HOSPITAL_COMMUNITY): Payer: Self-pay | Admitting: Anesthesiology

## 2011-12-21 ENCOUNTER — Encounter (HOSPITAL_COMMUNITY): Payer: Self-pay | Admitting: *Deleted

## 2011-12-21 ENCOUNTER — Ambulatory Visit (HOSPITAL_COMMUNITY): Payer: Medicare Other | Admitting: Anesthesiology

## 2011-12-21 ENCOUNTER — Ambulatory Visit (HOSPITAL_COMMUNITY): Payer: Medicare Other

## 2011-12-21 DIAGNOSIS — M1711 Unilateral primary osteoarthritis, right knee: Secondary | ICD-10-CM

## 2011-12-21 DIAGNOSIS — Z8541 Personal history of malignant neoplasm of cervix uteri: Secondary | ICD-10-CM

## 2011-12-21 DIAGNOSIS — D62 Acute posthemorrhagic anemia: Secondary | ICD-10-CM | POA: Diagnosis not present

## 2011-12-21 DIAGNOSIS — M1710 Unilateral primary osteoarthritis, unspecified knee: Principal | ICD-10-CM | POA: Diagnosis present

## 2011-12-21 DIAGNOSIS — I10 Essential (primary) hypertension: Secondary | ICD-10-CM | POA: Diagnosis present

## 2011-12-21 DIAGNOSIS — Z87891 Personal history of nicotine dependence: Secondary | ICD-10-CM

## 2011-12-21 HISTORY — DX: Urinary tract infection, site not specified: N39.0

## 2011-12-21 HISTORY — PX: KNEE ARTHROPLASTY: SHX992

## 2011-12-21 LAB — ABO/RH: ABO/RH(D): A POS

## 2011-12-21 SURGERY — ARTHROPLASTY, KNEE, TOTAL, USING IMAGELESS COMPUTER-ASSISTED NAVIGATION
Anesthesia: General | Site: Knee | Laterality: Right | Wound class: Clean

## 2011-12-21 MED ORDER — FENTANYL CITRATE 0.05 MG/ML IJ SOLN
INTRAMUSCULAR | Status: DC | PRN
  Administered 2011-12-21: 50 ug via INTRAVENOUS
  Administered 2011-12-21: 100 ug via INTRAVENOUS
  Administered 2011-12-21 (×4): 50 ug via INTRAVENOUS

## 2011-12-21 MED ORDER — PROPOFOL 10 MG/ML IV EMUL
INTRAVENOUS | Status: DC | PRN
  Administered 2011-12-21: 150 mg via INTRAVENOUS

## 2011-12-21 MED ORDER — MENTHOL 3 MG MT LOZG: 1.0000 | LOZENGE | OROMUCOSAL | Status: DC | PRN

## 2011-12-21 MED ORDER — GABAPENTIN 300 MG PO CAPS
600.0000 mg | ORAL_CAPSULE | Freq: Every day | ORAL | Status: DC
  Administered 2011-12-21 – 2011-12-23 (×3): 600 mg via ORAL
  Filled 2011-12-21 (×4): qty 2

## 2011-12-21 MED ORDER — METOCLOPRAMIDE HCL 5 MG/ML IJ SOLN
5.0000 mg | Freq: Three times a day (TID) | INTRAMUSCULAR | Status: DC | PRN
  Administered 2011-12-22: 10 mg via INTRAVENOUS
  Filled 2011-12-21: qty 2

## 2011-12-21 MED ORDER — ACETAMINOPHEN 10 MG/ML IV SOLN
INTRAVENOUS | Status: AC
  Filled 2011-12-21: qty 100

## 2011-12-21 MED ORDER — ONDANSETRON HCL 4 MG PO TABS: 4.0000 mg | ORAL_TABLET | Freq: Four times a day (QID) | ORAL | Status: DC | PRN

## 2011-12-21 MED ORDER — METHOCARBAMOL 100 MG/ML IJ SOLN
500.0000 mg | Freq: Four times a day (QID) | INTRAVENOUS | Status: DC | PRN
  Administered 2011-12-21: 500 mg via INTRAVENOUS
  Filled 2011-12-21 (×2): qty 5

## 2011-12-21 MED ORDER — DIPHENHYDRAMINE HCL 12.5 MG/5ML PO ELIX
12.5000 mg | ORAL_SOLUTION | Freq: Four times a day (QID) | ORAL | Status: DC | PRN
  Filled 2011-12-21: qty 5

## 2011-12-21 MED ORDER — HYDROMORPHONE HCL PF 1 MG/ML IJ SOLN
0.2500 mg | INTRAMUSCULAR | Status: DC | PRN
  Administered 2011-12-21 (×2): 0.5 mg via INTRAVENOUS

## 2011-12-21 MED ORDER — NEOSTIGMINE METHYLSULFATE 1 MG/ML IJ SOLN
INTRAMUSCULAR | Status: DC | PRN
  Administered 2011-12-21: 4 mg via INTRAVENOUS

## 2011-12-21 MED ORDER — MORPHINE SULFATE (PF) 1 MG/ML IV SOLN
INTRAVENOUS | Status: DC
  Administered 2011-12-21: 12:00:00 via INTRAVENOUS
  Administered 2011-12-21: 18 mg via INTRAVENOUS
  Administered 2011-12-21: 20:00:00 via INTRAVENOUS
  Administered 2011-12-21: 3 mg via INTRAVENOUS
  Administered 2011-12-22: 10.5 mg via INTRAVENOUS
  Administered 2011-12-22: 6.93 mg via INTRAVENOUS
  Administered 2011-12-22: 12 mg via INTRAVENOUS
  Administered 2011-12-22: 11.83 mg via INTRAVENOUS
  Administered 2011-12-22: 05:00:00 via INTRAVENOUS
  Administered 2011-12-23: 7.2 mg via INTRAVENOUS
  Administered 2011-12-23: 4.5 mg via INTRAVENOUS
  Administered 2011-12-23: 7.5 mg via INTRAVENOUS
  Filled 2011-12-21 (×4): qty 25

## 2011-12-21 MED ORDER — MORPHINE SULFATE (PF) 1 MG/ML IV SOLN
INTRAVENOUS | Status: AC
  Administered 2011-12-22: 31.92 mg via INTRAVENOUS
  Filled 2011-12-21: qty 25

## 2011-12-21 MED ORDER — RIVAROXABAN 10 MG PO TABS
10.0000 mg | ORAL_TABLET | Freq: Every day | ORAL | Status: DC
  Administered 2011-12-22 – 2011-12-24 (×3): 10 mg via ORAL
  Filled 2011-12-21 (×3): qty 1

## 2011-12-21 MED ORDER — ACETAMINOPHEN 650 MG RE SUPP: 650.0000 mg | Freq: Four times a day (QID) | RECTAL | Status: DC | PRN

## 2011-12-21 MED ORDER — DOCUSATE SODIUM 100 MG PO CAPS
100.0000 mg | ORAL_CAPSULE | Freq: Two times a day (BID) | ORAL | Status: DC
  Administered 2011-12-21 – 2011-12-24 (×6): 100 mg via ORAL
  Filled 2011-12-21 (×6): qty 1

## 2011-12-21 MED ORDER — DIPHENHYDRAMINE HCL 12.5 MG/5ML PO ELIX: 12.5000 mg | ORAL_SOLUTION | ORAL | Status: DC | PRN

## 2011-12-21 MED ORDER — LIDOCAINE HCL (CARDIAC) 20 MG/ML IV SOLN
INTRAVENOUS | Status: DC | PRN
  Administered 2011-12-21: 80 mg via INTRAVENOUS

## 2011-12-21 MED ORDER — MIDAZOLAM HCL 5 MG/5ML IJ SOLN
INTRAMUSCULAR | Status: DC | PRN
  Administered 2011-12-21: 2 mg via INTRAVENOUS

## 2011-12-21 MED ORDER — SODIUM CHLORIDE 0.9 % IV SOLN
INTRAVENOUS | Status: DC
  Administered 2011-12-21 – 2011-12-22 (×2): via INTRAVENOUS
  Administered 2011-12-22: 1000 mL via INTRAVENOUS

## 2011-12-21 MED ORDER — ACETAMINOPHEN 325 MG PO TABS: 650.0000 mg | ORAL_TABLET | Freq: Four times a day (QID) | ORAL | Status: DC | PRN

## 2011-12-21 MED ORDER — ZOLPIDEM TARTRATE 5 MG PO TABS: 5.0000 mg | ORAL_TABLET | Freq: Every evening | ORAL | Status: DC | PRN

## 2011-12-21 MED ORDER — CLINDAMYCIN PHOSPHATE 600 MG/50ML IV SOLN
INTRAVENOUS | Status: AC
  Filled 2011-12-21: qty 50

## 2011-12-21 MED ORDER — METOCLOPRAMIDE HCL 10 MG PO TABS: 5.0000 mg | ORAL_TABLET | Freq: Three times a day (TID) | ORAL | Status: DC | PRN

## 2011-12-21 MED ORDER — PHENOL 1.4 % MT LIQD: 1.0000 | OROMUCOSAL | Status: DC | PRN

## 2011-12-21 MED ORDER — SODIUM CHLORIDE 0.9 % IJ SOLN: 9.0000 mL | INTRAMUSCULAR | Status: DC | PRN

## 2011-12-21 MED ORDER — CEFAZOLIN SODIUM 1-5 GM-% IV SOLN
1.0000 g | Freq: Four times a day (QID) | INTRAVENOUS | Status: AC
  Administered 2011-12-21 – 2011-12-22 (×3): 1 g via INTRAVENOUS
  Filled 2011-12-21 (×3): qty 50

## 2011-12-21 MED ORDER — GLYCOPYRROLATE 0.2 MG/ML IJ SOLN
INTRAMUSCULAR | Status: DC | PRN
  Administered 2011-12-21: .5 mg via INTRAVENOUS

## 2011-12-21 MED ORDER — HYDROMORPHONE HCL PF 1 MG/ML IJ SOLN
INTRAMUSCULAR | Status: AC
  Filled 2011-12-21: qty 1

## 2011-12-21 MED ORDER — HYDROCODONE-ACETAMINOPHEN 5-325 MG PO TABS
1.0000 | ORAL_TABLET | ORAL | Status: DC | PRN
  Administered 2011-12-21: 1 via ORAL
  Administered 2011-12-23 – 2011-12-24 (×6): 2 via ORAL
  Filled 2011-12-21 (×3): qty 2
  Filled 2011-12-21: qty 1
  Filled 2011-12-21 (×3): qty 2

## 2011-12-21 MED ORDER — NALOXONE HCL 0.4 MG/ML IJ SOLN: 0.4000 mg | INTRAMUSCULAR | Status: DC | PRN

## 2011-12-21 MED ORDER — SUCCINYLCHOLINE CHLORIDE 20 MG/ML IJ SOLN
INTRAMUSCULAR | Status: DC | PRN
  Administered 2011-12-21: 100 mg via INTRAVENOUS

## 2011-12-21 MED ORDER — SODIUM CHLORIDE 0.9 % IR SOLN
Status: DC | PRN
  Administered 2011-12-21: 3000 mL

## 2011-12-21 MED ORDER — ONDANSETRON HCL 4 MG/2ML IJ SOLN
INTRAMUSCULAR | Status: DC | PRN
  Administered 2011-12-21: 4 mg via INTRAVENOUS

## 2011-12-21 MED ORDER — ROCURONIUM BROMIDE 100 MG/10ML IV SOLN
INTRAVENOUS | Status: DC | PRN
  Administered 2011-12-21: 40 mg via INTRAVENOUS

## 2011-12-21 MED ORDER — METHOCARBAMOL 500 MG PO TABS
500.0000 mg | ORAL_TABLET | Freq: Four times a day (QID) | ORAL | Status: DC | PRN
  Administered 2011-12-21 – 2011-12-24 (×6): 500 mg via ORAL
  Filled 2011-12-21 (×5): qty 1

## 2011-12-21 MED ORDER — DIPHENHYDRAMINE HCL 50 MG/ML IJ SOLN: 12.5000 mg | Freq: Four times a day (QID) | INTRAMUSCULAR | Status: DC | PRN

## 2011-12-21 MED ORDER — LACTATED RINGERS IV SOLN
INTRAVENOUS | Status: DC | PRN
  Administered 2011-12-21: 08:00:00 via INTRAVENOUS

## 2011-12-21 MED ORDER — MORPHINE SULFATE 2 MG/ML IJ SOLN: 1.0000 mg | INTRAMUSCULAR | Status: DC | PRN

## 2011-12-21 MED ORDER — OXYCODONE HCL 5 MG PO TABS: 5.0000 mg | ORAL_TABLET | ORAL | Status: DC | PRN

## 2011-12-21 MED ORDER — ONDANSETRON HCL 4 MG/2ML IJ SOLN: 4.0000 mg | Freq: Four times a day (QID) | INTRAMUSCULAR | Status: DC | PRN

## 2011-12-21 MED ORDER — ACETAMINOPHEN 10 MG/ML IV SOLN
INTRAVENOUS | Status: DC | PRN
  Administered 2011-12-21: 1000 mg via INTRAVENOUS

## 2011-12-21 MED ORDER — CLINDAMYCIN PHOSPHATE 600 MG/50ML IV SOLN
600.0000 mg | INTRAVENOUS | Status: AC
  Administered 2011-12-21: 600 mg via INTRAVENOUS

## 2011-12-21 MED ORDER — HYDROCHLOROTHIAZIDE 12.5 MG PO CAPS
12.5000 mg | ORAL_CAPSULE | Freq: Every day | ORAL | Status: DC
  Administered 2011-12-22 – 2011-12-24 (×3): 12.5 mg via ORAL
  Filled 2011-12-21 (×3): qty 1

## 2011-12-21 MED ORDER — HYDROMORPHONE HCL PF 1 MG/ML IJ SOLN
INTRAMUSCULAR | Status: DC | PRN
  Administered 2011-12-21 (×4): 0.5 mg via INTRAVENOUS

## 2011-12-21 SURGICAL SUPPLY — 50 items
BAG ZIPLOCK 12X15 (MISCELLANEOUS) ×2 IMPLANT
BANDAGE ELASTIC 6 VELCRO ST LF (GAUZE/BANDAGES/DRESSINGS) ×2 IMPLANT
BANDAGE ESMARK 6X9 LF (GAUZE/BANDAGES/DRESSINGS) ×1 IMPLANT
BLADE SAG 18X100X1.27 (BLADE) ×2 IMPLANT
BLADE SAW SGTL 13.0X1.19X90.0M (BLADE) ×2 IMPLANT
BLADE SURG SZ11 CARB STEEL (BLADE) ×2 IMPLANT
BNDG ESMARK 6X9 LF (GAUZE/BANDAGES/DRESSINGS) ×2
BOWL SMART MIX CTS (DISPOSABLE) ×2 IMPLANT
CEMENT HV SMART SET (Cement) ×4 IMPLANT
CLOTH BEACON ORANGE TIMEOUT ST (SAFETY) ×2 IMPLANT
CUFF TOURN SGL QUICK 34 (TOURNIQUET CUFF) ×1
CUFF TRNQT CYL 34X4X40X1 (TOURNIQUET CUFF) ×1 IMPLANT
DRAPE EXTREMITY T 121X128X90 (DRAPE) ×2 IMPLANT
DRAPE LG THREE QUARTER DISP (DRAPES) ×2 IMPLANT
DRAPE POUCH INSTRU U-SHP 10X18 (DRAPES) ×2 IMPLANT
DRAPE U-SHAPE 47X51 STRL (DRAPES) ×2 IMPLANT
DRSG PAD ABDOMINAL 8X10 ST (GAUZE/BANDAGES/DRESSINGS) ×2 IMPLANT
DURAPREP 26ML APPLICATOR (WOUND CARE) ×2 IMPLANT
ELECT REM PT RETURN 9FT ADLT (ELECTROSURGICAL) ×2
ELECTRODE REM PT RTRN 9FT ADLT (ELECTROSURGICAL) ×1 IMPLANT
EVACUATOR 1/8 PVC DRAIN (DRAIN) ×2 IMPLANT
FACESHIELD LNG OPTICON STERILE (SAFETY) ×6 IMPLANT
GAUZE XEROFORM 5X9 LF (GAUZE/BANDAGES/DRESSINGS) ×2 IMPLANT
GLOVE BIO SURGEON STRL SZ7.5 (GLOVE) ×2 IMPLANT
GOWN STRL REIN XL XLG (GOWN DISPOSABLE) ×2 IMPLANT
HANDPIECE INTERPULSE COAX TIP (DISPOSABLE) ×1
IMMOBILIZER KNEE 20 (SOFTGOODS) ×2
IMMOBILIZER KNEE 20 THIGH 36 (SOFTGOODS) ×1 IMPLANT
KIT BASIN OR (CUSTOM PROCEDURE TRAY) ×2 IMPLANT
NS IRRIG 1000ML POUR BTL (IV SOLUTION) ×2 IMPLANT
PACK TOTAL JOINT (CUSTOM PROCEDURE TRAY) ×2 IMPLANT
PADDING CAST COTTON 6X4 STRL (CAST SUPPLIES) ×2 IMPLANT
POSITIONER SURGICAL ARM (MISCELLANEOUS) ×2 IMPLANT
SET HNDPC FAN SPRY TIP SCT (DISPOSABLE) ×1 IMPLANT
SET PAD KNEE POSITIONER (MISCELLANEOUS) ×2 IMPLANT
SPONGE GAUZE 4X4 12PLY (GAUZE/BANDAGES/DRESSINGS) ×2 IMPLANT
SPONGE LAP 18X18 X RAY DECT (DISPOSABLE) IMPLANT
STAPLER VISISTAT 35W (STAPLE) ×2 IMPLANT
SUCTION FRAZIER 12FR DISP (SUCTIONS) ×2 IMPLANT
SUT VIC AB 0 CT1 27 (SUTURE) ×1
SUT VIC AB 0 CT1 27XBRD ANTBC (SUTURE) ×1 IMPLANT
SUT VIC AB 1 CT1 27 (SUTURE) ×1
SUT VIC AB 1 CT1 27XBRD ANTBC (SUTURE) ×1 IMPLANT
SUT VIC AB 2-0 CT1 27 (SUTURE) ×1
SUT VIC AB 2-0 CT1 TAPERPNT 27 (SUTURE) ×1 IMPLANT
SUT VLOC 180 0 24IN GS25 (SUTURE) ×2 IMPLANT
TOWEL OR 17X26 10 PK STRL BLUE (TOWEL DISPOSABLE) ×4 IMPLANT
TRAY FOLEY CATH 14FRSI W/METER (CATHETERS) ×2 IMPLANT
WATER STERILE IRR 1500ML POUR (IV SOLUTION) ×2 IMPLANT
WRAP KNEE MAXI GEL POST OP (GAUZE/BANDAGES/DRESSINGS) ×2 IMPLANT

## 2011-12-21 NOTE — Preoperative (Signed)
Beta Blockers   Reason not to administer Beta Blockers:Not Applicable 

## 2011-12-21 NOTE — Anesthesia Preprocedure Evaluation (Addendum)
Anesthesia Evaluation  Patient identified by MRN, date of birth, ID band Patient awake    Reviewed: Allergy & Precautions, H&P , NPO status , Patient's Chart, lab work & pertinent test results, reviewed documented beta blocker date and time   Airway Mallampati: II TM Distance: >3 FB Neck ROM: Full    Dental  (+) Teeth Intact and Dental Advisory Given   Pulmonary neg pulmonary ROS,  breath sounds clear to auscultation        Cardiovascular hypertension, Pt. on medications Rhythm:Regular Rate:Normal  Denies cardiac symptoms   Neuro/Psych negative neurological ROS  negative psych ROS   GI/Hepatic negative GI ROS, Neg liver ROS,   Endo/Other  negative endocrine ROS  Renal/GU negative Renal ROS  negative genitourinary   Musculoskeletal   Abdominal   Peds negative pediatric ROS (+)  Hematology negative hematology ROS (+)   Anesthesia Other Findings   Reproductive/Obstetrics negative OB ROS                           Anesthesia Physical Anesthesia Plan  ASA: II  Anesthesia Plan: General   Post-op Pain Management:    Induction: Intravenous  Airway Management Planned: Oral ETT  Additional Equipment:   Intra-op Plan:   Post-operative Plan: Extubation in OR  Informed Consent: I have reviewed the patients History and Physical, chart, labs and discussed the procedure including the risks, benefits and alternatives for the proposed anesthesia with the patient or authorized representative who has indicated his/her understanding and acceptance.   Dental advisory given  Plan Discussed with: CRNA and Surgeon  Anesthesia Plan Comments:         Anesthesia Quick Evaluation

## 2011-12-21 NOTE — Progress Notes (Signed)
Portable  AP and LATERAL right knee x-rays done.

## 2011-12-21 NOTE — Progress Notes (Signed)
Dr. Shireen Quan made aware of Aldrete scores on blood pressures.

## 2011-12-21 NOTE — Anesthesia Postprocedure Evaluation (Signed)
  Anesthesia Post-op Note  Patient: Monica Tyler  Procedure(s) Performed: Procedure(s) (LRB): COMPUTER ASSISTED TOTAL KNEE ARTHROPLASTY (Right)  Patient Location: PACU  Anesthesia Type: General  Level of Consciousness: oriented and sedated  Airway and Oxygen Therapy: Patient Spontanous Breathing and Patient connected to nasal cannula oxygen  Post-op Pain: mild  Post-op Assessment: Post-op Vital signs reviewed, Patient's Cardiovascular Status Stable, Respiratory Function Stable and Patent Airway  Post-op Vital Signs: stable  Complications: No apparent anesthesia complications
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
## 2011-12-21 NOTE — Progress Notes (Signed)
X-ray results noted 

## 2011-12-21 NOTE — Transfer of Care (Signed)
Immediate Anesthesia Transfer of Care Note  Patient: Monica Tyler  Procedure(s) Performed: Procedure(s) (LRB): COMPUTER ASSISTED TOTAL KNEE ARTHROPLASTY (Right)  Patient Location: PACU  Anesthesia Type: General  Level of Consciousness: awake, alert , oriented and patient cooperative  Airway & Oxygen Therapy: Patient Spontanous Breathing and Patient connected to face mask oxygen  Post-op Assessment: Report given to PACU RN, Post -op Vital signs reviewed and stable and Patient moving all extremities  Post vital signs: Reviewed  Complications: No apparent anesthesia complications

## 2011-12-21 NOTE — H&P (Signed)
  I have seen and examined Monica Tyler and she does wish to proceed with a right total knee replacement.  She understands fully the risks and benefits involved.

## 2011-12-21 NOTE — Brief Op Note (Signed)
12/21/2011  11:48 AM  PATIENT:  Monica Tyler  56 y.o. female  PRE-OPERATIVE DIAGNOSIS:  Right knee severe Osteoarthritis  POST-OPERATIVE DIAGNOSIS:  Right knee severe Osteoarthritis  PROCEDURE:  Procedure(s) (LRB): COMPUTER ASSISTED TOTAL KNEE ARTHROPLASTY (Right)  SURGEON:  Surgeon(s) and Role:    * Kathryne Hitch, MD - Primary  PHYSICIAN ASSISTANT:   ASSISTANTS: none   ANESTHESIA:   general  EBL:  Total I/O In: 2200 [I.V.:2200] Out: 400 [Urine:300; Blood:100]  BLOOD ADMINISTERED:none  DRAINS: (medium) Hemovact drain(s) in the knee with  Suction Open   LOCAL MEDICATIONS USED:  NONE  SPECIMEN:  No Specimen  DISPOSITION OF SPECIMEN:  N/A  COUNTS:  YES  TOURNIQUET:   Total Tourniquet Time Documented: Thigh (Right) - 81 minutes  DICTATION: .Other Dictation: Dictation Number [id number]  PLAN OF CARE: Admit to inpatient   PATIENT DISPOSITION:  PACU - hemodynamically stable.   Delay start of Pharmacological VTE agent (>24hrs) due to surgical blood loss or risk of bleeding: not applicable

## 2011-12-22 LAB — CBC
HCT: 36.5 % (ref 36.0–46.0)
Hemoglobin: 12.1 g/dL (ref 12.0–15.0)
RBC: 3.74 MIL/uL — ABNORMAL LOW (ref 3.87–5.11)
RDW: 12.4 % (ref 11.5–15.5)
WBC: 11.5 10*3/uL — ABNORMAL HIGH (ref 4.0–10.5)

## 2011-12-22 LAB — BASIC METABOLIC PANEL
BUN: 8 mg/dL (ref 6–23)
CO2: 30 mEq/L (ref 19–32)
Chloride: 99 mEq/L (ref 96–112)
GFR calc Af Amer: >90 mL/min (ref >90)
Glucose, Bld: 131 mg/dL — ABNORMAL HIGH (ref 70–99)
Potassium: 4.1 mEq/L (ref 3.5–5.1)

## 2011-12-22 MED ORDER — OXYCODONE-ACETAMINOPHEN 5-325 MG PO TABS: 1.0000 | ORAL_TABLET | ORAL | Status: AC | PRN

## 2011-12-22 MED ORDER — RIVAROXABAN 10 MG PO TABS: 10.0000 mg | ORAL_TABLET | Freq: Every day | ORAL | Status: DC

## 2011-12-22 NOTE — Op Note (Signed)
Monica Tyler, Monica Tyler              ACCOUNT NO.:  [id number]  MEDICAL RECORD NO.:  [id number]  LOCATION:  1610                         FACILITY:  [facility]  PHYSICIAN:  Vanita Panda. Magnus Ivan, M.D.DATE OF BIRTH:  1956-08-13  DATE OF PROCEDURE:  12/21/2011 DATE OF DISCHARGE:                              OPERATIVE REPORT   PREOPERATIVE DIAGNOSIS:  Severe osteoarthritis and degenerative joint disease, right knee.  POSTOPERATIVE DIAGNOSIS:  Severe osteoarthritis and degenerative joint disease, right knee.  PROCEDURE:  Right total knee arthroplasty utilizing computer navigation as assistance.  IMPLANTS:  DePuy rotating platform knee with size 2.5 femur, size 2.5 tibial tray, 10 mm polyethylene insert, 32 mm patellar button.  SURGEON:  Vanita Panda. Magnus Ivan, M.D.  ANESTHESIA:  General.  TOURNIQUET TIME:  Less than 2 hours.  BLOOD LOSS:  Less than 300 cc.  COMPLICATIONS:  None.  INDICATIONS:  Monica Tyler is a 56 year old female, well known to me. She has severe arthritis of her right knee with x-ray showing bone-on- bone wear of medial compartment as well as patellofemoral joint.  She has tried numerous injections in her knee as well as rest, anti- inflammatories, and even walking with assistive device and these things have not helped.  Due to impact in her activities of daily living, her poor mobility, and daily pain, she wished to proceed with a total knee arthroplasty.  Risks and benefits of this have been explained to her in detail.  She does wish to proceed with surgery.  DESCRIPTION OF PROCEDURE:  After informed consent was obtained, appropriate right leg was marked, she was brought to the operating room, placed supine on the operating table.  General anesthesia was then obtained.  A nonsterile tourniquet was placed around her upper right thigh and her right leg was prepped and draped from the thigh down to the ankle with DuraPrep and sterile drapes including  sterile stockinette.  A time-out was called and she was identified as the correct patient, correct right knee.  I then used the Esmarch to wrap out the leg and the tourniquet was inflated to 300 mm of pressure.  A midline incision was then made directly over the patella and was carried proximally and distally.  I dissected down to the knee joint and performed a medial parapatellar arthrotomy.  There was a large effusion in her knee and then right away, when we had the knee open, we could see there were osteophytes throughout the femur and significantly on the medial tibia.  There was complete bone loss in the medial tibial plateau as well as the medial femoral condyle.  The lateral femoral condyle also had changes of grade 3 to grade 4 chondromalacia and the patella had no cartilage left with large osteophytes.  I used a rongeur to remove all osteophytes that I could from the knee.  I removed remnants of ACL, PCL, medial and lateral meniscus and then proceeded with the computer navigation portion of the case.  Two stab incisions were made in the distal third of his tibia and Steinman pins were inserted temporarily from anteromedial to posterolateral direction in the metaphyseal section of the femur, 2 pins were likewise placed in the main incision.  Off these pins, small globes were placed to our computer map.  Model of the knee could be made using infrared camera and selecting points throughout the knee including the hip center of rotation.  Based on our model, we then decided to take 8 mm off the high side of the tibia and we made our tibial cut.  We then used soft tissue balancers to balance the knee within flexion and extension.  We next sized for 2.5 femur.  We made our computer plan.  We made a femoral neck cut about 9 mm of our distal femoral cut.  I then placed an extension block in the knee and it was stable in varus and valgus as well as the equal joint spaces.  We then put on  our cutting block with 2.5 femur after setting the rotation guide and we made our anterior and posterior cuts as well as our chamfer cuts. We then made our femoral box cut.  Attention was turned back to the tibia.  We sized for a size 2.5 tibial tray and felt like this fit nicely on the tibia.  I then used the drill for the post and punched for the keel.  We then placed the trial tibial tray followed by the trial femur.  We tried a 10 mm polyethylene insert and this was felt to be stable with full flexion extension, varus and valgus stability, and negative drawer.  We then drilled for a 32 patellar button and cut about 8 mm off the patella.  We then removed all trial instrumentation and copiously irrigated the knee with normal saline solution using pulsatile lavage.  We then cemented the real 2.5 mm rotating platform from DePuy. We centered the real 2.5 femur and placed the real 10 mm polyethylene insert.  I cemented to the real 32 patellar button.  Once the cement had dried, we let the tourniquet down.  Hemostasis was obtained with electrocautery.  We placed a medium Hemovac drain in the arthrotomy and then used a running V-Loc 0-suture to close the arthrotomy with 2-0 Vicryl in the subcutaneous tissue and interrupted staples on the skin. All the Steinmann pins were also removed and we repaired these with 3-0 nylon suture.  Xeroform followed by well-padded sterile dressing was applied.  A knee immobilizer and ice pack was placed.  She was awakened, extubated, and taken to the recovery room in stable condition.  All final counts were correct.  There were no complications noted.     Vanita Panda. Magnus Ivan, M.D.     CYB/MEDQ  D:  12/21/2011  T:  12/22/2011  Job:  [id number]

## 2011-12-22 NOTE — Plan of Care (Signed)
Problem: Phase II Progression Outcomes Goal: Ambulates Outcome: Progressing Progressing slowly with PT.
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
## 2011-12-22 NOTE — Discharge Instructions (Signed)
Move and bend your knee as much as possible. You can get your actual incision wet starting Wed. 12/26/11 Dry dressing daily as needed. Call [facility] at [phone] for questions/concerns. Follow-up with Dr. Magnus Ivan in 2 weeks.

## 2011-12-22 NOTE — Progress Notes (Signed)
Physical Therapy Treatment Patient Details Name: JENY NIELD MRN: [id number] DOB: 02-27-1956 Today's Date: 12/22/2011  PT Assessment/Plan  PT - Assessment/Plan Comments on Treatment Session: Pt progressing very slowly. Significantly limited by pain. Vomited during 2nd session. Yells out in pain with exercises. Daughter states she can assist at home. May need rehab, depending on progress.  PT Plan: Discharge plan remains appropriate PT Frequency: 7X/week Recommendations for Other Services: OT consult Follow Up Recommendations: Home health PT;Supervision/Assistance - 24 hour Equipment Recommended: None recommended by PT PT Goals  Acute Rehab PT Goals PT Goal Formulation: With patient Time For Goal Achievement: 7 days Pt will go Supine/Side to Sit: with min assist PT Goal: Supine/Side to Sit - Progress: Goal set today Pt will go Sit to Supine/Side: with min assist PT Goal: Sit to Supine/Side - Progress: Progressing toward goal Pt will go Sit to Stand: with min assist PT Goal: Sit to Stand - Progress: Progressing toward goal Pt will go Stand to Sit: with min assist PT Goal: Stand to Sit - Progress: Goal set today Pt will Ambulate: 16 - 50 feet;with min assist;with rolling walker PT Goal: Ambulate - Progress: Progressing toward goal Pt will Go Up / Down Stairs: 3-5 stairs;with least restrictive assistive device;with rail(s);with min assist PT Goal: Up/Down Stairs - Progress: Goal set today Pt will Perform Home Exercise Program: with supervision, verbal cues required/provided PT Goal: Perform Home Exercise Program - Progress: Progressing toward goal  PT Treatment Precautions/Restrictions  Precautions Precautions: Fall Required Braces or Orthoses: Yes Knee Immobilizer: Discontinue once straight leg raise with < 10 degree lag Restrictions Weight Bearing Restrictions: Yes RLE Weight Bearing: Weight bearing as tolerated Mobility (including Balance) Bed Mobility Bed Mobility:  Yes Supine to Sit: 1: +2 Total assist;HOB elevated (Comment degrees);With rails Supine to Sit Details (indicate cue type and reason): Pt=50%. VCs safety, technique, hand placement. Assist for R LE off bed and trunk to upright Sitting - Scoot to Edge of Bed: 3: Mod assist Sitting - Scoot to Edge of Bed Details (indicate cue type and reason): Utilized pad to assist with scooting, positioning Sit to Supine: 1: +2 Total assist Sit to Supine - Details (indicate cue type and reason): Pt=50%. VCs safety, technique. Assist for bil LEs onto bed and trunk to supine Transfers Transfers: Yes Sit to Stand: 1: +2 Total assist;From chair/3-in-1;With upper extremity assist;With armrests Sit to Stand Details (indicate cue type and reason): Pt=50%. VCs safety, technique, hand placement. Assist to rise, stabilize.  Stand to Sit: 3: Mod assist;With upper extremity assist;With armrests;To bed;To chair/3-in-1 Stand to Sit Details: x 2. VCs safety, technique, hand placement. assist to control descent.  Ambulation/Gait Ambulation/Gait: Yes Ambulation/Gait Assistance: 1: +2 Total assist Ambulation/Gait Assistance Details (indicate cue type and reason): Pt=70%. VCs safety, technique, sequence. Increased time. Fatigues easily. C/o dizziness.  Ambulation Distance (Feet): 6 Feet Assistive device: Rolling walker Gait Pattern: Step-to pattern;Antalgic;Decreased stance time - right;Decreased step length - left;Decreased step length - right;Decreased stride length    Exercise  Total Joint Exercises Ankle Circles/Pumps: AROM;Both;Supine Quad Sets: AROM;Both;Supine;10 reps Heel Slides: AAROM;Right;10 reps;Supine Hip ABduction/ADduction: AAROM;Right;10 reps;Supine Straight Leg Raises: AAROM;Right;Supine;5 reps End of Session PT - End of Session Equipment Utilized During Treatment: Gait belt;Right knee immobilizer Activity Tolerance: Patient limited by pain;Patient limited by fatigue Patient left: in bed;with call bell  in reach;with family/visitor present General Behavior During Session: [facility] for tasks performed Cognition: [facility] for tasks performed  Rebeca Alert [facility] 12/22/2011, 3:54 PM [phone]

## 2011-12-22 NOTE — Progress Notes (Addendum)
Cm spoke with pt concerning dc  Pt offered choice for [facility].. Pt states living in [place]. Per pt choice [facility] to provide [facility] services. Pt's first choice [facility], agency unable to accept pt's insurance. [facility] rep Talmadge Coventry notified of new pt referral. Clinicals placed in [facility]. Pt has RW,3n1 at home. Adult daughter to provide in home care for pt for 1 month.   Leonie Green [phone]

## 2011-12-22 NOTE — Progress Notes (Signed)
Subjective: 1 Day Post-Op Procedure(s) (LRB): COMPUTER ASSISTED TOTAL KNEE ARTHROPLASTY (Right) Patient reports pain as moderate.    Objective: Vital signs in last 24 hours: Temp:  [97.4 F (36.3 C)-98.6 F (37 C)] 98.2 F (36.8 C) (03/30 1014) Pulse Rate:  [50-108] 108  (03/30 1014) Resp:  [9-22] 16  (03/30 1014) BP: (120-193)/(77-99) 122/82 mmHg (03/30 1014) SpO2:  [96 %-100 %] 98 % (03/30 1014) Weight:  [92.534 kg (204 lb)] 92.534 kg (204 lb) (03/29 1633)  Intake/Output from previous day: 03/29 0701 - 03/30 0700 In: 4135 [P.O.:480; I.V.:3550; IV Piggyback:105] Out: 2160 [Urine:1885; Drains:175; Blood:100] Intake/Output this shift:     Basename 12/22/11 0408  HGB 12.1    Basename 12/22/11 0408  WBC 11.5*  RBC 3.74*  HCT 36.5  PLT 264    Basename 12/22/11 0408  NA 134*  K 4.1  CL 99  CO2 30  BUN 8  CREATININE 0.63  GLUCOSE 131*  CALCIUM 9.2   No results found for this basename: LABPT:2,INR:2 in the last 72 hours  Neurovascular intact Sensation intact distally Intact pulses distally Dorsiflexion/Plantar flexion intact Incision: dressing C/D/I Compartment soft  Assessment/Plan: 1 Day Post-Op Procedure(s) (LRB): COMPUTER ASSISTED TOTAL KNEE ARTHROPLASTY (Right) Up with therapy D/C hemovac Continue CPM   Quashawn Jewkes Y 12/22/2011, 10:26 AM

## 2011-12-23 LAB — CBC
HCT: 32.5 % — ABNORMAL LOW (ref 36.0–46.0)
Hemoglobin: 10.6 g/dL — ABNORMAL LOW (ref 12.0–15.0)
MCHC: 32.6 g/dL (ref 30.0–36.0)

## 2011-12-23 NOTE — Progress Notes (Signed)
Physical Therapy Treatment Patient Details Name: Monica Tyler MRN: [id number] DOB: 1956-03-07 Today's Date: 12/23/2011  PT Assessment/Plan  PT - Assessment/Plan Comments on Treatment Session: progressing slowly but better overall today; pain 1/10, premedicated PT Plan: Discharge plan remains appropriate PT Frequency: 7X/week Follow Up Recommendations: Home health PT;Supervision/Assistance - 24 hour Equipment Recommended: None recommended by PT PT Goals  Acute Rehab PT Goals Pt will go Supine/Side to Sit: with min assist PT Goal: Supine/Side to Sit - Progress: Progressing toward goal Pt will go Sit to Stand: with min assist PT Goal: Sit to Stand - Progress: Progressing toward goal Pt will go Stand to Sit: with min assist PT Goal: Stand to Sit - Progress: Progressing toward goal Pt will Ambulate: 16 - 50 feet;with min assist;with rolling walker PT Goal: Ambulate - Progress: Progressing toward goal Pt will Perform Home Exercise Program: with supervision, verbal cues required/provided PT Goal: Perform Home Exercise Program - Progress: Progressing toward goal  PT Treatment Precautions/Restrictions  Precautions Precautions: Fall Required Braces or Orthoses: Yes Knee Immobilizer: Discontinue once straight leg raise with < 10 degree lag Restrictions Weight Bearing Restrictions: Yes RLE Weight Bearing: Weight bearing as tolerated Mobility (including Balance) Bed Mobility Supine to Sit: 1: +2 Total assist;HOB elevated (Comment degrees);With rails Supine to Sit Details (indicate cue type and reason): pt=65%; VCs safety, technique, hand placement. Assist for R LE off bed and trunk to upright Transfers Sit to Stand: 3: Mod assist;With upper extremity assist;From bed;From chair/3-in-1 Sit to Stand Details (indicate cue type and reason): cues for hand placement and LE position Stand to Sit: 3: Mod assist;With upper extremity assist;With armrests;To chair/3-in-1 Stand to Sit Details:  stand pivot bed to [facility] with RW and min-mod assist; cues for  Ambulation/Gait Ambulation/Gait Assistance: 4: Min assist Ambulation/Gait Assistance Details (indicate cue type and reason): cues for sequence  Ambulation Distance (Feet): 20 Feet Assistive device: Rolling walker Gait Pattern: Step-to pattern;Antalgic;Decreased stance time - right;Decreased step length - left;Decreased step length - right;Decreased stride length    Exercise  Total Joint Exercises Ankle Circles/Pumps: AROM;Both;Supine Quad Sets: AROM;Both;Supine;10 reps Heel Slides: AAROM;Right;10 reps;Supine Straight Leg Raises: AAROM;Right;10 reps End of Session PT - End of Session Equipment Utilized During Treatment: Gait belt;Right knee immobilizer Activity Tolerance: Patient limited by pain;Patient limited by fatigue Patient left: in chair;with call bell in reach;with family/visitor present Nurse Communication: Mobility status for transfers;Mobility status for ambulation General Behavior During Session: [facility] for tasks performed Cognition: [facility] for tasks performed  [facility] 12/23/2011, 10:25 AM

## 2011-12-23 NOTE — Progress Notes (Signed)
Physical Therapy Treatment Patient Details Name: Monica Tyler MRN: [id number] DOB: 12-11-1955 Today's Date: 12/23/2011  PT Assessment/Plan  PT - Assessment/Plan Comments on Treatment Session: pt c/o feeling shaky and chilled; RN aware, BP 107/67, amb distance limited by fatigue this pm, still doing better overall today PT Plan: Discharge plan remains appropriate;Frequency remains appropriate PT Frequency: 7X/week Follow Up Recommendations: Home health PT;Supervision/Assistance - 24 hour Equipment Recommended: None recommended by PT PT Goals  Acute Rehab PT Goals Pt will go Supine/Side to Sit: with min assist PT Goal: Supine/Side to Sit - Progress: Progressing toward goal Pt will go Sit to Supine/Side: with min assist PT Goal: Sit to Supine/Side - Progress: Progressing toward goal Pt will go Sit to Stand: with min assist PT Goal: Sit to Stand - Progress: Progressing toward goal Pt will go Stand to Sit: with min assist PT Goal: Stand to Sit - Progress: Progressing toward goal Pt will Ambulate: 16 - 50 feet;with min assist;with rolling walker PT Goal: Ambulate - Progress: Progressing toward goal Pt will Perform Home Exercise Program: with supervision, verbal cues required/provided PT Goal: Perform Home Exercise Program - Progress: Progressing toward goal  PT Treatment Precautions/Restrictions  Precautions Precautions: Knee Required Braces or Orthoses: Yes Knee Immobilizer: Discontinue once straight leg raise with < 10 degree lag Restrictions Weight Bearing Restrictions: No RLE Weight Bearing: Weight bearing as tolerated Mobility (including Balance) Bed Mobility Sit to Supine: 3: Mod assist;HOB flat Sit to Supine - Details (indicate cue type and reason): cues for technique Transfers Sit to Stand: 4: Min assist;From chair/3-in-1;With upper extremity assist Sit to Stand Details (indicate cue type and reason): X2; cues for hands and RLE position Stand to Sit: 4: Min assist;With  upper extremity assist;With armrests;To chair/3-in-1;To bed Stand to Sit Details: cues for hand placement and LE position Ambulation/Gait Ambulation/Gait Assistance: 4: Min assist Ambulation/Gait Assistance Details (indicate cue type and reason): cues for RW position and sequence Ambulation Distance (Feet): 15 Feet (X2) Assistive device: Rolling walker Gait Pattern: Step-to pattern;Antalgic;Decreased stance time - right;Decreased step length - left;Decreased step length - right;Decreased stride length    End of Session PT - End of Session Equipment Utilized During Treatment: Gait belt;Right knee immobilizer Activity Tolerance: Patient limited by fatigue Patient left: in bed;with call bell in reach;with family/visitor present Nurse Communication: Mobility status for transfers;Mobility status for ambulation General Behavior During Session: [facility] for tasks performed Cognition: [facility] for tasks performed  [facility] 12/23/2011, 1:16 PM
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
## 2011-12-23 NOTE — Progress Notes (Signed)
Patient ID: Monica Tyler, female   DOB: Jan 04, 1956, 56 y.o.   MRN: [id number] IV and PCA d/c'd Possible d/c Monday Labs stable

## 2011-12-24 ENCOUNTER — Encounter (HOSPITAL_COMMUNITY): Payer: Self-pay | Admitting: Orthopaedic Surgery

## 2011-12-24 LAB — CBC
MCH: 32.3 pg (ref 26.0–34.0)
MCV: 97.4 fL (ref 78.0–100.0)
Platelets: 218 10*3/uL (ref 150–400)
RBC: 3.03 MIL/uL — ABNORMAL LOW (ref 3.87–5.11)

## 2011-12-24 NOTE — Progress Notes (Signed)
UR COMPLETED  

## 2011-12-24 NOTE — Progress Notes (Signed)
Physical Therapy Treatment Patient Details Name: MCKENZYE CUTRIGHT MRN: [id number] DOB: [birth year] Today's Date: 12/24/2011  PT Assessment/Plan  PT - Assessment/Plan Comments on Treatment Session: pt doing well; feels she will likely be able to return home today; dtr present; pt pain in right knee 1-2/10 PT Plan: Discharge plan remains appropriate;Frequency remains appropriate PT Frequency: 7X/week Follow Up Recommendations: Home health PT;Supervision - Intermittent Equipment Recommended: None recommended by PT;None recommended by OT PT Goals  Acute Rehab PT Goals Pt will go Sit to Stand: with min assist PT Goal: Sit to Stand - Progress: Met Pt will go Stand to Sit: with min assist PT Goal: Stand to Sit - Progress: Met Pt will Ambulate: 16 - 50 feet;with min assist;with rolling walker PT Goal: Ambulate - Progress: Met Pt will Go Up / Down Stairs: 3-5 stairs;with least restrictive assistive device;with rail(s);with min assist PT Goal: Up/Down Stairs - Progress: Met  PT Treatment Precautions/Restrictions  Precautions Precautions: Knee Precaution Comments: educated daughter on use of KI:  stated she didn't use last night when she got oob Required Braces or Orthoses: Yes Knee Immobilizer: Discontinue once straight leg raise with < 10 degree lag Restrictions Weight Bearing Restrictions: No RLE Weight Bearing: Weight bearing as tolerated Mobility (including Balance) Transfers Sit to Stand: 5: Supervision Sit to Stand Details (indicate cue type and reason): VCs hand placement Stand to Sit: 5: Supervision;With armrests;With upper extremity assist;To chair/3-in-1 Stand to Sit Details: cues for hand placement and LE position Ambulation/Gait Ambulation/Gait Assistance: 5: Supervision;4: Min assist Ambulation/Gait Assistance Details (indicate cue type and reason): min/guard, cues breathing Ambulation Distance (Feet): 80 Feet Assistive device: Rolling walker Gait Pattern: Step-to  pattern Stairs: Yes Stairs Assistance: 4: Min assist Stairs Assistance Details (indicate cue type and reason): cues sequence Stair Management Technique: Two rails;Step to pattern;Forwards Number of Stairs: 5     Exercise    End of Session PT - End of Session Activity Tolerance: Patient tolerated treatment well Patient left: in chair;with call bell in reach;with family/visitor present Nurse Communication: Mobility status for transfers;Mobility status for ambulation General Behavior During Session: [facility] for tasks performed Cognition: [facility] for tasks performed  [facility] 12/24/2011, 11:06 AM

## 2011-12-24 NOTE — Progress Notes (Signed)
D/C instructions reviewed w/ pt and daughter.  All questions answered, no further questions. Pt d/c in w/c in stable condition by NT. Pt in possession of d/c instructions, scripts, and all personal belongings.

## 2011-12-24 NOTE — Progress Notes (Signed)
Physical Therapy Treatment Patient Details Name: Monica Tyler MRN: [id number] DOB: 1956-06-16 Today's Date: 12/24/2011  PT Assessment/Plan  PT - Assessment/Plan Comments on Treatment Session: pt and dtr ready for D/C. pt reports being sleepy , pain controlled PT Plan: Discharge plan remains appropriate;Frequency remains appropriate PT Frequency: 7X/week Follow Up Recommendations: Home health PT;Supervision - Intermittent Equipment Recommended: None recommended by PT;None recommended by OT PT Goals  Acute Rehab PT Goals Pt will go Sit to Stand: with min assist PT Goal: Sit to Stand - Progress: Met Pt will go Stand to Sit: with min assist PT Goal: Stand to Sit - Progress: Met Pt will Ambulate: 16 - 50 feet;with min assist;with rolling walker PT Goal: Ambulate - Progress: Met Pt will Go Up / Down Stairs: 3-5 stairs;with least restrictive assistive device;with rail(s);with min assist PT Goal: Up/Down Stairs - Progress: Met Pt will Perform Home Exercise Program: with supervision, verbal cues required/provided PT Goal: Perform Home Exercise Program - Progress: Partly met  PT Treatment Precautions/Restrictions  Precautions Precautions: Knee Precaution Comments: educated daughter on use of KI:  stated she didn't use last night when she got oob Required Braces or Orthoses: Yes Knee Immobilizer: On except when in CPM;Discontinue once straight leg raise with < 10 degree lag Restrictions Weight Bearing Restrictions: No RLE Weight Bearing: Weight bearing as tolerated Exercise  Total Joint Exercises Ankle Circles/Pumps: AROM;20 reps;Both Quad Sets: AROM;Both;10 reps Heel Slides: AAROM;Right;10 reps Straight Leg Raises: AAROM;Right;10 reps End of Session PT - End of Session Activity Tolerance: Patient tolerated treatment well Patient left: in chair;with call bell in reach;with family/visitor present Nurse Communication: Other (comment) (ready for D/C with dtr  assist) General Behavior During Session: [facility] for tasks performed Cognition: [facility] for tasks performed  [facility] 12/24/2011, 11:23 AM

## 2011-12-24 NOTE — Progress Notes (Signed)
Subjective: Pt reports not much activity yesterday.  Felt dizzy during last session with PT and could not complete.  Still has stairs to do.  Uncomfortable this am.  Sitting in recliner. Feels she may not be ready to go home until tomorrow.   Objective: Vital signs in last 24 hours: Temp:  [98 F (36.7 C)-99.4 F (37.4 C)] 98.1 F (36.7 C) (04/01 0555) Pulse Rate:  [99-105] 99  (04/01 0555) Resp:  [16-18] 16  (04/01 0555) BP: (103-128)/(70-77) 128/76 mmHg (04/01 0555) SpO2:  [96 %-99 %] 99 % (04/01 0555) Weight:  [78.654 kg (173 lb 6.4 oz)] 78.654 kg (173 lb 6.4 oz) (04/01 0300)  Intake/Output from previous day: 03/31 0701 - 04/01 0700 In: 1598.8 [P.O.:1260; I.V.:288.8] Out: 2400 [Urine:2400] Intake/Output this shift:     Basename 12/24/11 0412 12/23/11 0425 12/22/11 0408  HGB 9.8* 10.6* 12.1    Basename 12/24/11 0412 12/23/11 0425  WBC 11.0* 11.4*  RBC 3.03* 3.32*  HCT 29.5* 32.5*  PLT 218 213    Basename 12/22/11 0408  NA 134*  K 4.1  CL 99  CO2 30  BUN 8  CREATININE 0.63  GLUCOSE 131*  CALCIUM 9.2   No results found for this basename: LABPT:2,INR:2 in the last 72 hours  Neurovascular intact Intact pulses distally Dorsiflexion/Plantar flexion intact Incision: no drainage  Assessment/Plan: More PT today and stairs if pt feels she can tolerate.  If she does well today would consider discharge later today. If not then plan for tomorrow am. Dressing change today. Continue ice packs ABL anemia  Will monitor. Stable at present   Monica Tyler M 12/24/2011, 7:24 AM

## 2011-12-24 NOTE — Progress Notes (Signed)
Occupational Therapy Treatment Patient Details Name: Monica Tyler MRN: [id number] DOB: 1956/02/03 Today's Date: 12/24/2011  OT Assessment/Plan OT Assessment/Plan OT Frequency: Min 2X/week Follow Up Recommendations: Home health OT Equipment Recommended: None recommended by PT;None recommended by OT OT Goals ADL Goals Pt Will Transfer to Toilet: with supervision;Ambulation;3-in-1 ADL Goal: Toilet Transfer - Progress: Progressing toward goals Pt Will Perform Toileting - Hygiene: with min assist;Sit to stand from 3-in-1/toilet ADL Goal: Toileting - Hygiene - Progress: Partly met--performed sitting/weight shifting; front only Pt Will Perform Tub/Shower Transfer: Shower transfer;with supervision;Ambulation ADL Goal: Tub/Shower Transfer - Progress: Progressing toward goals--min guard and cues  OT Treatment Precautions/Restrictions  Precautions Precautions: Knee Precaution Comments: educated daughter on use of KI:  stated she didn't use last night when she got oob Knee Immobilizer: Discontinue once straight leg raise with < 10 degree lag Restrictions Weight Bearing Restrictions: No RLE Weight Bearing: Weight bearing as tolerated   ADL ADL Toilet Transfer: Performed;Minimal assistance (min guard for sit to stand and supervision ambulating) Toilet Transfer Details (indicate cue type and reason): l vc for hand placement Toilet Transfer Method: Proofreader: Extra wide bedside commode Toileting - Hygiene: Performed;Set up Where Assessed - Toileting Hygiene: Sit on 3-in-1 or toilet;Other (comment) (weight shifting side to side) Tub/Shower Transfer: Performed;Supervision/safety;Other (comment) (ambulating; min guard for sit to stand with 1 vc for hands) Tub/Shower Transfer Details (indicate cue type and reason): vc to take whole walker into shower with her Tub/Shower Transfer Method: Ambulating Equipment Used: Rolling walker Ambulation Related to ADLs: supervision  once standing ADL Comments: c/o feeling funny in her head; bp 130/84 Mobility  Transfers Sit to Stand: Other (comment) (min guard) Sit to Stand Details (indicate cue type and reason): 1 vc hand placement Exercises    End of Session OT - End of Session Activity Tolerance: Patient tolerated treatment well Patient left: in chair;with call bell in reach;with family/visitor present Nurse Communication: Other (comment) (bp & c/o feeling funny) General Behavior During Session: [facility] for tasks performed Cognition: [facility] for tasks performed [facility], OTR/L [phone] 12/24/2011 Monica Tyler  12/24/2011, 9:45 AM

## 2011-12-31 NOTE — Discharge Summary (Signed)
Patient ID: Monica Tyler MRN: [id number] DOB/AGE: Jun 30, 1956 56 y.o.  Admit date: 12/21/2011 Discharge date: 12/31/2011  Admission Diagnoses:  Principal Problem:  *Degenerative arthritis of right knee   Discharge Diagnoses:  Same  Past Medical History  Diagnosis Date  . Hypertension   . Asthma     NO ASTHMA PROBLEMS SINCE TEEN YEARS  . Cancer     HX CERVICAL CANCER IN THE 80'S  . Arthritis     PAIN AND SEVERE ARTHRITIS RIGHT KNEE;  ALSO ARTHRITIS  LEFT KNEE BUT PAIN NOT AS BAD  . DDD (degenerative disc disease)     LUMBAR AND CERVICAL--S/P 2 LUMBAR SURGERIE--CHRONIC NECK AND BACK PAIN AND NUMBNESS DOWN RIGHT LEG AND  TOES ON RT FOOT AND HANDS SOMETIMES GO NUMB  . UTI (lower urinary tract infection) 12/15/11    started antibiotic at home. Pt does not know name of drug.    Surgeries: Procedure(s): COMPUTER ASSISTED TOTAL KNEE ARTHROPLASTY on 12/21/2011   Consultants:    Discharged Condition: Improved  Hospital Course: [name] is an 56 y.o. female who was admitted 12/21/2011 for operative treatment ofDegenerative arthritis of right knee. Patient has severe unremitting pain that affects sleep, daily activities, and work/hobbies. After pre-op clearance the patient was taken to the operating room on 12/21/2011 and underwent  Procedure(s): COMPUTER ASSISTED TOTAL KNEE ARTHROPLASTY.    Patient was given perioperative antibiotics:  Anti-infectives     Start     Dose/Rate Route Frequency Ordered Stop   12/21/11 1600   ceFAZolin (ANCEF) IVPB 1 g/50 mL premix        1 g 100 mL/hr over 30 Minutes Intravenous Every 6 hours 12/21/11 1506 12/22/11 0436   12/21/11 0715   clindamycin (CLEOCIN) IVPB 600 mg        600 mg 100 mL/hr over 30 Minutes Intravenous 60 min pre-op 12/21/11 0715 12/21/11 0932           Patient was given sequential compression devices, early ambulation, and chemoprophylaxis to prevent DVT.  Patient benefited maximally from hospital stay and there  were no complications.    Recent vital signs: No data found.    Recent laboratory studies: No results found for this basename: WBC:2,HGB:2,HCT:2,PLT:2,NA:2,K:2,CL:2,CO2:2,BUN:2,CREATININE:2,GLUCOSE:2,PT:2,INR:2,CALCIUM,2: in the last 72 hours   Discharge Medications:   Medication List  As of 12/31/2011  5:35 PM   STOP taking these medications         meloxicam 15 MG tablet         TAKE these medications         cyclobenzaprine 10 MG tablet   Commonly known as: FLEXERIL   Take 10 mg by mouth at bedtime.      gabapentin 300 MG capsule   Commonly known as: NEURONTIN   Take 600 mg by mouth at bedtime.      GLUCOSAMINE 1500 COMPLEX PO   Take 1 tablet by mouth 2 (two) times daily.      GLUCOSAMINE HCL-MSM PO   Take by mouth. GLUCOSAMINE 1500 MG / MSM 1500 MG  TAKE ONE TWICE A DAY      hydrochlorothiazide 12.5 MG capsule   Commonly known as: MICROZIDE   Take 12.5 mg by mouth every morning.      HYDROcodone-acetaminophen 5-500 MG per tablet   Commonly known as: VICODIN   Take by mouth every 8 (eight) hours. FOR CHRONIC BACK PAIN      oxyCODONE-acetaminophen 5-325 MG per tablet   Commonly known as: PERCOCET  Take 1-2 tablets by mouth every 4 (four) hours as needed for pain.      rivaroxaban 10 MG Tabs tablet   Commonly known as: XARELTO   Take 1 tablet (10 mg total) by mouth daily with breakfast.            Diagnostic Studies: Dg Chest 2 View  12/12/2011  *RADIOLOGY REPORT*  Clinical Data: Preoperative respiratory evaluation for knee replacement.  CHEST - 2 VIEW  Comparison: 05/21/2005  Findings: The lungs are clear without focal infiltrate, edema, pneumothorax or pleural effusion. The cardiopericardial silhouette is within normal limits for size. Imaged bony structures of the thorax are intact.  IMPRESSION: Stable.  No new or acute interval findings.  Original Report Authenticated By: ERIC A. MANSELL, M.D.   X-ray Knee Right Port  12/21/2011  *RADIOLOGY REPORT*   Clinical Data: Post right knee arthroplasty.  PORTABLE RIGHT KNEE - 1-2 VIEW  Comparison: Knee MRI 09/09/2010  Findings: Two views of the right knee demonstrate a total knee arthroplasty.  There is a surgical drain present.  Expected soft tissue changes and skin staples are present. Two lucent screw tracks in the mid tibia with irregularity of the posterior cortex. The findings are consistent with previous hardware in this area.  IMPRESSION: Right knee arthroplasty.  No complicating features.  Evidence of previous hardware in the mid tibia.  Original Report Authenticated By: Richarda Overlie, M.D.    Disposition: 06-Home-Health Care Svc  Discharge Orders    Future Orders Please Complete By Expires   Diet - low sodium heart healthy      Call MD / Call 911      Comments:   If you experience chest pain or shortness of breath, CALL 911 and be transported to the hospital emergency room.  If you develope a fever above 101 F, pus (white drainage) or increased drainage or redness at the wound, or calf pain, call your surgeon's office.   Constipation Prevention      Comments:   Drink plenty of fluids.  Prune juice may be helpful.  You may use a stool softener, such as Colace (over the counter) 100 mg twice a day.  Use MiraLax (over the counter) for constipation as needed.   Increase activity slowly as tolerated      Weight Bearing as taught in Physical Therapy      Comments:   Use a walker or crutches as instructed.         SignedKathryne Hitch 12/31/2011, 5:35 PM

## 2011-12-31 NOTE — Discharge Summary (Signed)
Physician Discharge Summary  Patient ID: Monica Tyler MRN: [id number] DOB/AGE: Dec 22, 1955 56 y.o.  Admit date: 12/21/2011 Discharge date: 12/31/2011  Admission Diagnoses:  Degenerative arthritis of right knee  Discharge Diagnoses:  Principal Problem:  *Degenerative arthritis of right knee acute blood loss anemia  Past Medical History  Diagnosis Date  . Hypertension   . Asthma     NO ASTHMA PROBLEMS SINCE TEEN YEARS  . Cancer     HX CERVICAL CANCER IN THE 80'S  . Arthritis     PAIN AND SEVERE ARTHRITIS RIGHT KNEE;  ALSO ARTHRITIS  LEFT KNEE BUT PAIN NOT AS BAD  . DDD (degenerative disc disease)     LUMBAR AND CERVICAL--S/P 2 LUMBAR SURGERIE--CHRONIC NECK AND BACK PAIN AND NUMBNESS DOWN RIGHT LEG AND  TOES ON RT FOOT AND HANDS SOMETIMES GO NUMB  . UTI (lower urinary tract infection) 12/15/11    started antibiotic at home. Pt does not know name of drug.    Surgeries: Procedure(s): COMPUTER ASSISTED TOTAL KNEE ARTHROPLASTY on 12/21/2011   Consultants (if any):  none  Discharged Condition: Improved  Hospital Course: Monica Tyler is an 56 y.o. female who was admitted 12/21/2011 with a diagnosis of Degenerative arthritis of right knee and went to the operating room on 12/21/2011 and underwent the above named procedures.    She was given perioperative antibiotics:  Anti-infectives     Start     Dose/Rate Route Frequency Ordered Stop   12/21/11 1600   ceFAZolin (ANCEF) IVPB 1 g/50 mL premix        1 g 100 mL/hr over 30 Minutes Intravenous Every 6 hours 12/21/11 1506 12/22/11 0436   12/21/11 0715   clindamycin (CLEOCIN) IVPB 600 mg        600 mg 100 mL/hr over 30 Minutes Intravenous 60 min pre-op 12/21/11 0715 12/21/11 0932        .  She was given sequential compression devices, early ambulation, and chemoprophylaxis for DVT prophylaxis.  She benefited maximally from the hospital stay and there were no complications.    Recent vital signs:  Filed Vitals:   12/24/11 0938  BP: 134/80  Pulse:   Temp:   Resp:     Recent laboratory studies:  Lab Results  Component Value Date   HGB 9.8* 12/24/2011   HGB 10.6* 12/23/2011   HGB 12.1 12/22/2011   Lab Results  Component Value Date   WBC 11.0* 12/24/2011   PLT 218 12/24/2011   Lab Results  Component Value Date   INR 0.95 12/12/2011   Lab Results  Component Value Date   NA 134* 12/22/2011   K 4.1 12/22/2011   CL 99 12/22/2011   CO2 30 12/22/2011   BUN 8 12/22/2011   CREATININE 0.63 12/22/2011   GLUCOSE 131* 12/22/2011    Discharge Medications:   Medication List  As of 12/31/2011  2:17 PM   STOP taking these medications         meloxicam 15 MG tablet         TAKE these medications         cyclobenzaprine 10 MG tablet   Commonly known as: FLEXERIL   Take 10 mg by mouth at bedtime.      gabapentin 300 MG capsule   Commonly known as: NEURONTIN   Take 600 mg by mouth at bedtime.      GLUCOSAMINE 1500 COMPLEX PO   Take 1 tablet by mouth 2 (two) times daily.  GLUCOSAMINE HCL-MSM PO   Take by mouth. GLUCOSAMINE 1500 MG / MSM 1500 MG  TAKE ONE TWICE A DAY      hydrochlorothiazide 12.5 MG capsule   Commonly known as: MICROZIDE   Take 12.5 mg by mouth every morning.      HYDROcodone-acetaminophen 5-500 MG per tablet   Commonly known as: VICODIN   Take by mouth every 8 (eight) hours. FOR CHRONIC BACK PAIN      oxyCODONE-acetaminophen 5-325 MG per tablet   Commonly known as: PERCOCET   Take 1-2 tablets by mouth every 4 (four) hours as needed for pain.      rivaroxaban 10 MG Tabs tablet   Commonly known as: XARELTO   Take 1 tablet (10 mg total) by mouth daily with breakfast.            Diagnostic Studies: Dg Chest 2 View  12/12/2011  *RADIOLOGY REPORT*  Clinical Data: Preoperative respiratory evaluation for knee replacement.  CHEST - 2 VIEW  Comparison: 05/21/2005  Findings: The lungs are clear without focal infiltrate, edema, pneumothorax or pleural effusion. The  cardiopericardial silhouette is within normal limits for size. Imaged bony structures of the thorax are intact.  IMPRESSION: Stable.  No new or acute interval findings.  Original Report Authenticated By: ERIC A. MANSELL, M.D.   X-ray Knee Right Port  12/21/2011  *RADIOLOGY REPORT*  Clinical Data: Post right knee arthroplasty.  PORTABLE RIGHT KNEE - 1-2 VIEW  Comparison: Knee MRI 09/09/2010  Findings: Two views of the right knee demonstrate a total knee arthroplasty.  There is a surgical drain present.  Expected soft tissue changes and skin staples are present. Two lucent screw tracks in the mid tibia with irregularity of the posterior cortex. The findings are consistent with previous hardware in this area.  IMPRESSION: Right knee arthroplasty.  No complicating features.  Evidence of previous hardware in the mid tibia.  Original Report Authenticated By: Richarda Overlie, M.D.    Disposition: 06-Home-Health Care Svc  Discharge Orders    Future Orders Please Complete By Expires   Diet - low sodium heart healthy      Call MD / Call 911      Comments:   If you experience chest pain or shortness of breath, CALL 911 and be transported to the hospital emergency room.  If you develope a fever above 101 F, pus (white drainage) or increased drainage or redness at the wound, or calf pain, call your surgeon's office.   Constipation Prevention      Comments:   Drink plenty of fluids.  Prune juice may be helpful.  You may use a stool softener, such as Colace (over the counter) 100 mg twice a day.  Use MiraLax (over the counter) for constipation as needed.   Increase activity slowly as tolerated      Weight Bearing as taught in Physical Therapy      Comments:   Use a walker or crutches as instructed.         Signed: Wende Neighbors 12/31/2011, 2:17 PM

## 2013-02-10 IMAGING — CR DG KNEE 1-2V PORT*R*
1 series · 2 of 2 positions shown · non-contrast
Comparison: Knee MRI 09/09/2010

CLINICAL DATA: Post right knee arthroplasty.

PORTABLE RIGHT KNEE - 1-2 VIEW

[Series 1: AP · right · 2 of 2 slices shown]
[im 1/2]
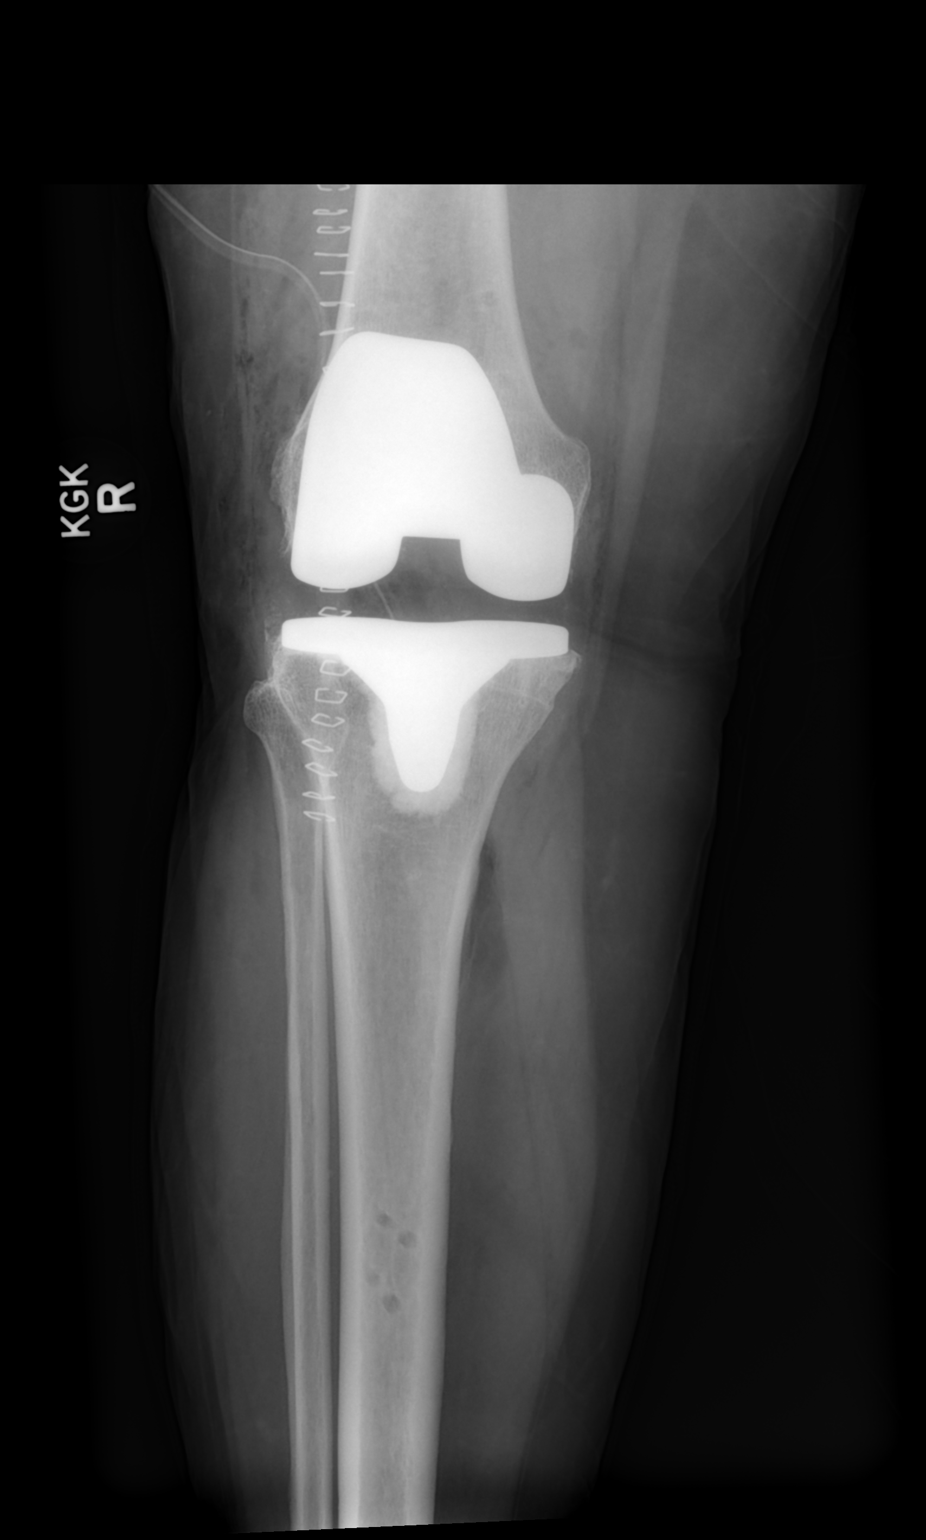
[im 2/2]
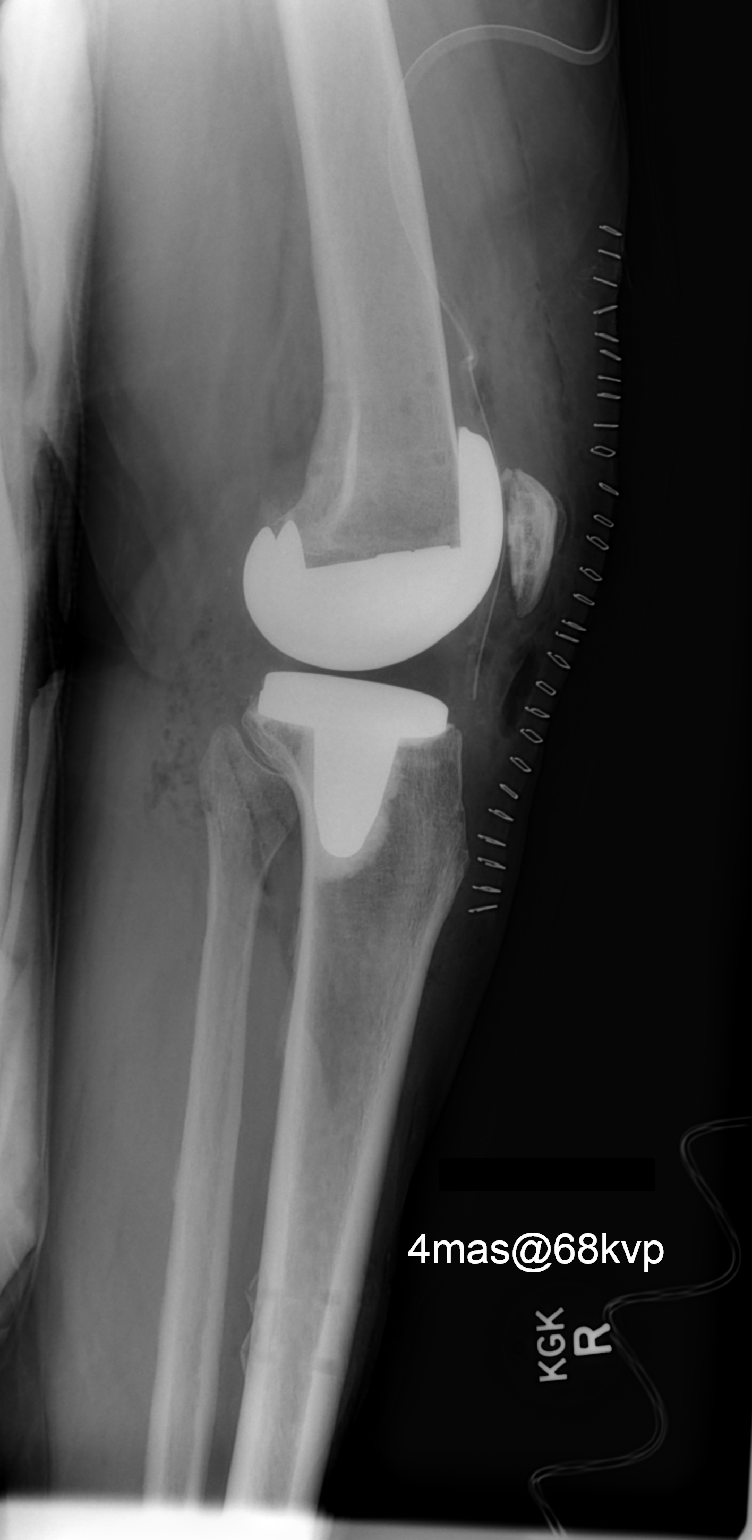

[2 of 2 positions shown; findings below may reference images not displayed]

FINDINGS: Two views of the right knee demonstrate a total knee
arthroplasty.  There is a surgical drain present.  Expected soft
tissue changes and skin staples are present. Two lucent screw
tracks in the mid tibia with irregularity of the posterior cortex.
The findings are consistent with previous hardware in this area.
IMPRESSION: Right knee arthroplasty.  No complicating features.

Evidence of previous hardware in the mid tibia.

## 2014-09-24 HISTORY — PX: COLONOSCOPY: SHX174

## 2016-08-31 DIAGNOSIS — Z1389 Encounter for screening for other disorder: Secondary | ICD-10-CM | POA: Diagnosis not present

## 2016-08-31 DIAGNOSIS — I10 Essential (primary) hypertension: Secondary | ICD-10-CM | POA: Diagnosis not present

## 2016-08-31 DIAGNOSIS — E782 Mixed hyperlipidemia: Secondary | ICD-10-CM | POA: Diagnosis not present

## 2016-08-31 DIAGNOSIS — Z0000 Encounter for general adult medical examination without abnormal findings: Secondary | ICD-10-CM | POA: Diagnosis not present

## 2016-10-15 DIAGNOSIS — G894 Chronic pain syndrome: Secondary | ICD-10-CM | POA: Diagnosis not present

## 2016-10-15 DIAGNOSIS — M47816 Spondylosis without myelopathy or radiculopathy, lumbar region: Secondary | ICD-10-CM | POA: Diagnosis not present

## 2016-12-11 DIAGNOSIS — M4716 Other spondylosis with myelopathy, lumbar region: Secondary | ICD-10-CM | POA: Diagnosis not present

## 2016-12-11 DIAGNOSIS — G894 Chronic pain syndrome: Secondary | ICD-10-CM | POA: Diagnosis not present

## 2016-12-11 DIAGNOSIS — Z79899 Other long term (current) drug therapy: Secondary | ICD-10-CM | POA: Diagnosis not present

## 2016-12-11 DIAGNOSIS — Z79891 Long term (current) use of opiate analgesic: Secondary | ICD-10-CM | POA: Diagnosis not present

## 2017-01-01 ENCOUNTER — Encounter: Payer: Self-pay | Admitting: Obstetrics & Gynecology

## 2017-01-01 ENCOUNTER — Ambulatory Visit (INDEPENDENT_AMBULATORY_CARE_PROVIDER_SITE_OTHER): Payer: PPO | Admitting: Obstetrics & Gynecology

## 2017-01-01 VITALS — BP 130/80 | HR 73 | Ht 65.0 in | Wt 203.0 lb

## 2017-01-01 DIAGNOSIS — Z1239 Encounter for other screening for malignant neoplasm of breast: Secondary | ICD-10-CM

## 2017-01-01 DIAGNOSIS — Z1231 Encounter for screening mammogram for malignant neoplasm of breast: Secondary | ICD-10-CM | POA: Diagnosis not present

## 2017-01-01 DIAGNOSIS — R875 Abnormal microbiological findings in specimens from female genital organs: Secondary | ICD-10-CM | POA: Diagnosis not present

## 2017-01-01 DIAGNOSIS — Z0000 Encounter for general adult medical examination without abnormal findings: Secondary | ICD-10-CM | POA: Diagnosis not present

## 2017-01-01 DIAGNOSIS — Z87410 Personal history of cervical dysplasia: Secondary | ICD-10-CM | POA: Diagnosis not present

## 2017-01-01 NOTE — Patient Instructions (Signed)
Call [facility] to schedule Mammogram when ready

## 2017-01-01 NOTE — Progress Notes (Signed)
HPI:      [name] is a 61 y.o. [id number] who LMP was in the past, she presents today for her annual examination.  The patient has no complaints today. The patient is sexually active. h/o Cervical Dysplasia years ago; 2016 last pap: was normal. The patient is not taking hormone replacement therapy. Patient denies post-menopausal vaginal bleeding.   The patient has regular exercise: yes.   GYN Hx: Last Colonoscopy:1 year ago. Normal.  Last DEXA: (not done yet) ago.  PMHx: She  has a past medical history of Arthritis; Asthma; Cancer ([place]); DDD (degenerative disc disease); Hypertension; and UTI (lower urinary tract infection) (12/15/11). Also,  has a past surgical history that includes Back surgery; [place] IN DOCTOR'S OFFICE; Tubal ligation; SKIN GRAFT FROM RIGHT GROIN TO SCALP (1980'S); and Knee Arthroplasty (12/21/2011)., family history includes Breast cancer in her mother.,  reports that she has quit smoking. She has never used smokeless tobacco. She reports that she does not drink alcohol or use drugs.  She has a current medication list which includes the following prescription(s): amlodipine, cyclobenzaprine, gabapentin, glucosamine hcl-msm, glucosamine-chondroit-vit c-mn, hydrochlorothiazide, hydrocodone-acetaminophen, hydrocodone-acetaminophen, meloxicam, rivaroxaban, and tizanidine. Also, is allergic to codeine and penicillins.  Review of Systems  Constitutional: Negative for chills, fever and malaise/fatigue.  HENT: Negative for congestion, sinus pain and sore throat.   Eyes: Negative for blurred vision and pain.  Respiratory: Negative for cough and wheezing.   Cardiovascular: Negative for chest pain and leg swelling.  Gastrointestinal: Negative for abdominal pain, constipation, diarrhea, heartburn, nausea and vomiting.  Genitourinary: Negative for dysuria, frequency, hematuria and urgency.  Musculoskeletal: Negative for back pain, joint pain,  myalgias and neck pain.  Skin: Negative for itching and rash.  Neurological: Negative for dizziness, tremors and weakness.  Endo/Heme/Allergies: Does not bruise/bleed easily.  Psychiatric/Behavioral: Negative for depression. The patient is not nervous/anxious and does not have insomnia.     Objective: BP 130/80   Pulse 73   Ht 5\' 5"  (1.651 m)   Wt 203 lb (92.1 kg)   BMI 33.78 kg/m  Physical Exam  Constitutional: She is oriented to person, place, and time. She appears well-developed and well-nourished. No distress.  Genitourinary: Rectum normal, vagina normal and uterus normal. Pelvic exam was performed with patient supine. There is no rash or lesion on the right labia. There is no rash or lesion on the left labia. Vagina exhibits no lesion. No bleeding in the vagina. Right adnexum does not display mass and does not display tenderness. Left adnexum does not display mass and does not display tenderness. Cervix does not exhibit motion tenderness, lesion, friability or polyp.   Uterus is mobile and midaxial. Uterus is not enlarged or exhibiting a mass.  HENT:  Head: Normocephalic and atraumatic. Head is without laceration.  Right Ear: Hearing normal.  Left Ear: Hearing normal.  Nose: No epistaxis.  No foreign bodies.  Mouth/Throat: Uvula is midline, oropharynx is clear and moist and mucous membranes are normal.  Eyes: Pupils are equal, round, and reactive to light.  Neck: Normal range of motion. Neck supple. No thyromegaly present.  Cardiovascular: Normal rate and regular rhythm.  Exam reveals no gallop and no friction rub.   No murmur heard. Pulmonary/Chest: Effort normal and breath sounds normal. No respiratory distress. She has no wheezes. Right breast exhibits no mass, no skin change and no tenderness. Left breast exhibits no mass, no skin change and no tenderness.  Abdominal: Soft. Bowel sounds are normal. She  exhibits no distension. There is no tenderness. There is no rebound.    Musculoskeletal: Normal range of motion.  Neurological: She is alert and oriented to person, place, and time. No cranial nerve deficit.  Skin: Skin is warm and dry.  Psychiatric: She has a normal mood and affect. Judgment normal.  Vitals reviewed.   Assessment: Annual Exam 1. Annual physical exam   2. Screening for breast cancer   3. History of cervical dysplasia    Plan:            1.  Cervical Screening-  Pap smear done today, 2 2 years due to hx  2. Breast screening- Exam annually and mammogram scheduled  3. Colonoscopy every 10 years, Hemoccult testing after age 7  4. Labs per PCP   5. Counseling for hormonal therapy: none     F/U  Return in about 1 year (around 01/01/2018) for Annual.  Barnett Applebaum, MD, [facility], [facility] 01/01/2017  5:16 PM

## 2017-01-05 LAB — IGP, APTIMA HPV
HPV APTIMA: NEGATIVE
PAP SMEAR COMMENT: 0

## 2017-01-22 ENCOUNTER — Ambulatory Visit: Payer: Self-pay

## 2017-02-01 ENCOUNTER — Ambulatory Visit
Admission: RE | Admit: 2017-02-01 | Discharge: 2017-02-01 | Disposition: A | Discharge status: Home / Self Care | Payer: PPO | Source: Ambulatory Visit | Attending: Obstetrics & Gynecology | Admitting: Obstetrics & Gynecology

## 2017-02-01 DIAGNOSIS — Z1239 Encounter for other screening for malignant neoplasm of breast: Secondary | ICD-10-CM

## 2017-02-01 DIAGNOSIS — Z1231 Encounter for screening mammogram for malignant neoplasm of breast: Secondary | ICD-10-CM | POA: Insufficient documentation

## 2017-02-05 ENCOUNTER — Inpatient Hospital Stay
Admission: RE | Admit: 2017-02-05 | Discharge: 2017-02-05 | Disposition: A | Discharge status: Home / Self Care | Payer: Self-pay | Source: Ambulatory Visit | Attending: *Deleted | Admitting: *Deleted

## 2017-02-05 ENCOUNTER — Encounter: Payer: Self-pay | Admitting: Obstetrics & Gynecology

## 2017-02-05 ENCOUNTER — Other Ambulatory Visit: Payer: Self-pay | Admitting: *Deleted

## 2017-02-05 DIAGNOSIS — Z9289 Personal history of other medical treatment: Secondary | ICD-10-CM

## 2017-02-19 DIAGNOSIS — G894 Chronic pain syndrome: Secondary | ICD-10-CM | POA: Diagnosis not present

## 2017-02-19 DIAGNOSIS — M47816 Spondylosis without myelopathy or radiculopathy, lumbar region: Secondary | ICD-10-CM | POA: Diagnosis not present

## 2017-03-22 DIAGNOSIS — I10 Essential (primary) hypertension: Secondary | ICD-10-CM | POA: Diagnosis not present

## 2017-04-22 DIAGNOSIS — G894 Chronic pain syndrome: Secondary | ICD-10-CM | POA: Diagnosis not present

## 2017-04-22 DIAGNOSIS — M4716 Other spondylosis with myelopathy, lumbar region: Secondary | ICD-10-CM | POA: Diagnosis not present

## 2017-04-22 DIAGNOSIS — M542 Cervicalgia: Secondary | ICD-10-CM | POA: Diagnosis not present

## 2017-05-16 DIAGNOSIS — H2511 Age-related nuclear cataract, right eye: Secondary | ICD-10-CM | POA: Diagnosis not present

## 2017-05-16 DIAGNOSIS — H40003 Preglaucoma, unspecified, bilateral: Secondary | ICD-10-CM | POA: Diagnosis not present

## 2017-05-23 DIAGNOSIS — R609 Edema, unspecified: Secondary | ICD-10-CM | POA: Diagnosis not present

## 2017-06-12 DIAGNOSIS — Z79899 Other long term (current) drug therapy: Secondary | ICD-10-CM | POA: Diagnosis not present

## 2017-06-12 DIAGNOSIS — G894 Chronic pain syndrome: Secondary | ICD-10-CM | POA: Diagnosis not present

## 2017-06-12 DIAGNOSIS — M79605 Pain in left leg: Secondary | ICD-10-CM | POA: Diagnosis not present

## 2017-06-12 DIAGNOSIS — M5106 Intervertebral disc disorders with myelopathy, lumbar region: Secondary | ICD-10-CM | POA: Diagnosis not present

## 2017-06-12 DIAGNOSIS — Z79891 Long term (current) use of opiate analgesic: Secondary | ICD-10-CM | POA: Diagnosis not present

## 2017-06-12 DIAGNOSIS — M545 Low back pain: Secondary | ICD-10-CM | POA: Diagnosis not present

## 2017-08-13 DIAGNOSIS — G894 Chronic pain syndrome: Secondary | ICD-10-CM | POA: Diagnosis not present

## 2017-08-13 DIAGNOSIS — M791 Myalgia, unspecified site: Secondary | ICD-10-CM | POA: Diagnosis not present

## 2017-08-13 DIAGNOSIS — M542 Cervicalgia: Secondary | ICD-10-CM | POA: Diagnosis not present

## 2017-09-23 ENCOUNTER — Encounter: Payer: Self-pay | Admitting: Obstetrics & Gynecology

## 2017-09-23 ENCOUNTER — Ambulatory Visit (INDEPENDENT_AMBULATORY_CARE_PROVIDER_SITE_OTHER): Payer: PPO | Admitting: Obstetrics & Gynecology

## 2017-09-23 VITALS — BP 140/82 | HR 87 | Ht 65.0 in | Wt 216.0 lb

## 2017-09-23 DIAGNOSIS — L03313 Cellulitis of chest wall: Secondary | ICD-10-CM | POA: Diagnosis not present

## 2017-09-23 DIAGNOSIS — N6081 Other benign mammary dysplasias of right breast: Secondary | ICD-10-CM

## 2017-09-23 MED ORDER — FLUCONAZOLE 150 MG PO TABS: 150.0000 mg | ORAL_TABLET | Freq: Once | ORAL | 3 refills | Status: AC

## 2017-09-23 MED ORDER — CEPHALEXIN 500 MG PO CAPS: 500.0000 mg | ORAL_CAPSULE | Freq: Four times a day (QID) | ORAL | 0 refills | Status: DC

## 2017-09-23 NOTE — Progress Notes (Signed)
HPI:      [name] is a 61 y.o. [id number] who is postmenopausal, presents today for a problem visit.  She complains of one week history of skin lesion on inner right breast with redness, feels like a pea, NT.  Discharge when squeezed. on the rightside which she first noticed several days ago.  It has not significantly changed.  Associated symptoms include No fever or pain.  Denies nipple discharge.  Prior Mammogram: normal in May 2018. Prior breast problems: No Family History: Breast Cancer-relatedfamily history includes Breast cancer in her mother and sister.  PMHx: She  has a past medical history of Arthritis, Asthma, Cancer ([place]), DDD (degenerative disc disease), Hypertension, and UTI (lower urinary tract infection) (12/15/11). Also,  has a past surgical history that includes Back surgery; [place] IN DOCTOR'S OFFICE; Tubal ligation; SKIN GRAFT FROM RIGHT GROIN TO SCALP (1980'S); and Knee Arthroplasty (12/21/2011)., family history includes Breast cancer in her mother and sister.,  reports that she has quit smoking. she has never used smokeless tobacco. She reports that she does not drink alcohol or use drugs.  She has a current medication list which includes the following prescription(s): amlodipine, cyclobenzaprine, gabapentin, meloxicam, cephalexin, fluconazole, glucosamine hcl-msm, glucosamine-chondroit-vit c-mn, hydrochlorothiazide, hydrocodone-acetaminophen, hydrocodone-acetaminophen, rivaroxaban, and tizanidine. Also, is allergic to codeine and penicillins.  Review of Systems  Constitutional: Negative for chills, fever and malaise/fatigue.  HENT: Negative for congestion, sinus pain and sore throat.   Eyes: Negative for blurred vision and pain.  Respiratory: Negative for cough and wheezing.   Cardiovascular: Negative for chest pain and leg swelling.  Gastrointestinal: Negative for abdominal pain, constipation, diarrhea, heartburn, nausea and vomiting.    Genitourinary: Negative for dysuria, frequency, hematuria and urgency.  Musculoskeletal: Negative for back pain, joint pain, myalgias and neck pain.  Skin: Negative for itching and rash.  Neurological: Negative for dizziness, tremors and weakness.  Endo/Heme/Allergies: Does not bruise/bleed easily.  Psychiatric/Behavioral: Negative for depression. The patient is not nervous/anxious and does not have insomnia.     Objective: BP 140/82   Pulse 87   Ht 5\' 5"  (1.651 m)   Wt 216 lb (98 kg)   BMI 35.94 kg/m  Physical Exam  Constitutional: She is oriented to person, place, and time. She appears well-developed and well-nourished. No distress.  Cardiovascular: Normal rate, regular rhythm, normal heart sounds and normal pulses. Exam reveals no gallop and no friction rub.  No murmur heard. Pulmonary/Chest: Effort normal and breath sounds normal. She exhibits no mass, no tenderness and no edema. Right breast exhibits skin change. Right breast exhibits no inverted nipple, no mass, no nipple discharge and no tenderness. Left breast exhibits no inverted nipple, no mass, no nipple discharge, no skin change and no tenderness. There is no breast swelling.  Right Inner Lower Quadrant 0.5 cm skin lesion w head and small amount of surrounding erythema, sl T to touch  Abdominal: Normal appearance.  Musculoskeletal: Normal range of motion.  Lymphadenopathy:    She has no axillary adenopathy.       Right axillary: No pectoral and no lateral adenopathy present.       Left axillary: No pectoral and no lateral adenopathy present. Neurological: She is alert and oriented to person, place, and time.  Skin: Skin is warm and dry. No abrasion, no bruising, no lesion and no rash noted. No erythema.  Psychiatric: She has a normal mood and affect. Her speech is normal and behavior is normal. Judgment normal.  Vitals  reviewed.  ASSESSMENT/PLAN: breast mass, likely benign (new) 1. Sebaceous cyst of skin of breast,  right - referral if persists, recurrent - new finding so monitor after heat therapy and ABX  2. Cellulitis of chest wall - ABX (Keflex); pros cons and side effects discussed. - Difulcan prophylaxis  Barnett Applebaum, MD, [facility], [facility] 09/23/2017  3:49 PM

## 2017-09-23 NOTE — Patient Instructions (Signed)
Cephalexin tablets or capsules What is this medicine? CEPHALEXIN (sef a LEX in) is a cephalosporin antibiotic. It is used to treat certain kinds of bacterial infections It will not work for colds, flu, or other viral infections. This medicine may be used for other purposes; ask your health care provider or pharmacist if you have questions. COMMON BRAND NAME(S): Biocef, Daxbia, Keflex, Keftab What should I tell my health care provider before I take this medicine? They need to know if you have any of these conditions: -kidney disease -stomach or intestine problems, especially colitis -an unusual or allergic reaction to cephalexin, other cephalosporins, other antibiotics, medicines, foods, dyes or preservatives -pregnant or trying to get pregnant -breast-feeding How should I use this medicine? Take this medicine by mouth with a full glass of water. Follow the directions on the prescription label. This medicine can be taken with or without food. Take your medicine at regular intervals. Do not take your medicine more often than directed. Take all of your medicine as directed even if you think you are better. Do not skip doses or stop your medicine early. Talk to your pediatrician regarding the use of this medicine in children. While this drug may be prescribed for selected conditions, precautions do apply. Overdosage: If you think you have taken too much of this medicine contact a poison control center or emergency room at once. NOTE: This medicine is only for you. Do not share this medicine with others. What if I miss a dose? If you miss a dose, take it as soon as you can. If it is almost time for your next dose, take only that dose. Do not take double or extra doses. There should be at least 4 to 6 hours between doses. What may interact with this medicine? -probenecid -some other antibiotics This list may not describe all possible interactions. Give your health care provider a list of all the  medicines, herbs, non-prescription drugs, or dietary supplements you use. Also tell them if you smoke, drink alcohol, or use illegal drugs. Some items may interact with your medicine. What should I watch for while using this medicine? Tell your doctor or health care professional if your symptoms do not begin to improve in a few days. Do not treat diarrhea with over the counter products. Contact your doctor if you have diarrhea that lasts more than 2 days or if it is severe and watery. If you have diabetes, you may get a false-positive result for sugar in your urine. Check with your doctor or health care professional. What side effects may I notice from receiving this medicine? Side effects that you should report to your doctor or health care professional as soon as possible: -allergic reactions like skin rash, itching or hives, swelling of the face, lips, or tongue -breathing problems -pain or trouble passing urine -redness, blistering, peeling or loosening of the skin, including inside the mouth -severe or watery diarrhea -unusually weak or tired -yellowing of the eyes, skin Side effects that usually do not require medical attention (report to your doctor or health care professional if they continue or are bothersome): -gas or heartburn -genital or anal irritation -headache -joint or muscle pain -nausea, vomiting This list may not describe all possible side effects. Call your doctor for medical advice about side effects. You may report side effects to FDA at 1-800-FDA-1088. Where should I keep my medicine? Keep out of the reach of children. Store at room temperature between 59 and 86 degrees F (15 and 30  degrees C). Throw away any unused medicine after the expiration date. NOTE: This sheet is a summary. It may not cover all possible information. If you have questions about this medicine, talk to your doctor, pharmacist, or health care provider.  2018 Elsevier/Gold Standard (2007-12-15  17:09:13)

## 2017-10-14 DIAGNOSIS — G894 Chronic pain syndrome: Secondary | ICD-10-CM | POA: Diagnosis not present

## 2017-10-14 DIAGNOSIS — M791 Myalgia, unspecified site: Secondary | ICD-10-CM | POA: Diagnosis not present

## 2017-10-14 DIAGNOSIS — M4716 Other spondylosis with myelopathy, lumbar region: Secondary | ICD-10-CM | POA: Diagnosis not present

## 2017-10-14 DIAGNOSIS — M542 Cervicalgia: Secondary | ICD-10-CM | POA: Diagnosis not present

## 2017-11-22 DIAGNOSIS — I10 Essential (primary) hypertension: Secondary | ICD-10-CM | POA: Diagnosis not present

## 2017-11-22 DIAGNOSIS — Z1389 Encounter for screening for other disorder: Secondary | ICD-10-CM | POA: Diagnosis not present

## 2017-11-22 DIAGNOSIS — Z0000 Encounter for general adult medical examination without abnormal findings: Secondary | ICD-10-CM | POA: Diagnosis not present

## 2017-11-22 DIAGNOSIS — M545 Low back pain: Secondary | ICD-10-CM | POA: Diagnosis not present

## 2017-11-22 DIAGNOSIS — E669 Obesity, unspecified: Secondary | ICD-10-CM | POA: Diagnosis not present

## 2017-11-22 DIAGNOSIS — Z1159 Encounter for screening for other viral diseases: Secondary | ICD-10-CM | POA: Diagnosis not present

## 2017-11-22 DIAGNOSIS — E782 Mixed hyperlipidemia: Secondary | ICD-10-CM | POA: Diagnosis not present

## 2017-11-22 DIAGNOSIS — M179 Osteoarthritis of knee, unspecified: Secondary | ICD-10-CM | POA: Diagnosis not present

## 2017-12-11 DIAGNOSIS — Z79899 Other long term (current) drug therapy: Secondary | ICD-10-CM | POA: Diagnosis not present

## 2017-12-11 DIAGNOSIS — M4716 Other spondylosis with myelopathy, lumbar region: Secondary | ICD-10-CM | POA: Diagnosis not present

## 2017-12-11 DIAGNOSIS — G894 Chronic pain syndrome: Secondary | ICD-10-CM | POA: Diagnosis not present

## 2017-12-11 DIAGNOSIS — M791 Myalgia, unspecified site: Secondary | ICD-10-CM | POA: Diagnosis not present

## 2018-02-10 DIAGNOSIS — M791 Myalgia, unspecified site: Secondary | ICD-10-CM | POA: Diagnosis not present

## 2018-02-10 DIAGNOSIS — G894 Chronic pain syndrome: Secondary | ICD-10-CM | POA: Diagnosis not present

## 2018-02-10 DIAGNOSIS — M4716 Other spondylosis with myelopathy, lumbar region: Secondary | ICD-10-CM | POA: Diagnosis not present

## 2018-03-11 ENCOUNTER — Other Ambulatory Visit: Payer: Self-pay | Admitting: Obstetrics & Gynecology

## 2018-03-26 ENCOUNTER — Ambulatory Visit (INDEPENDENT_AMBULATORY_CARE_PROVIDER_SITE_OTHER): Payer: PPO | Admitting: Obstetrics & Gynecology

## 2018-03-26 ENCOUNTER — Encounter: Payer: Self-pay | Admitting: Obstetrics & Gynecology

## 2018-03-26 VITALS — BP 138/82 | Ht 65.0 in | Wt 217.0 lb

## 2018-03-26 DIAGNOSIS — Z1231 Encounter for screening mammogram for malignant neoplasm of breast: Secondary | ICD-10-CM

## 2018-03-26 DIAGNOSIS — Z0000 Encounter for general adult medical examination without abnormal findings: Secondary | ICD-10-CM | POA: Diagnosis not present

## 2018-03-26 DIAGNOSIS — Z1239 Encounter for other screening for malignant neoplasm of breast: Secondary | ICD-10-CM

## 2018-03-26 DIAGNOSIS — Z1211 Encounter for screening for malignant neoplasm of colon: Secondary | ICD-10-CM | POA: Diagnosis not present
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
## 2018-03-26 NOTE — Patient Instructions (Signed)
PAP every three years Mammogram every year    Call [phone] to schedule at [facility] Colonoscopy every 10 years Labs yearly (with PCP) 

## 2018-04-04 DIAGNOSIS — Z1211 Encounter for screening for malignant neoplasm of colon: Secondary | ICD-10-CM | POA: Diagnosis not present

## 2018-04-06 LAB — SPECIMEN STATUS REPORT

## 2018-04-06 LAB — FECAL OCCULT BLOOD, IMMUNOCHEMICAL: Fecal Occult Bld: NEGATIVE

## 2018-04-10 DIAGNOSIS — G894 Chronic pain syndrome: Secondary | ICD-10-CM | POA: Diagnosis not present

## 2018-04-10 DIAGNOSIS — M791 Myalgia, unspecified site: Secondary | ICD-10-CM | POA: Diagnosis not present

## 2018-04-10 DIAGNOSIS — M4716 Other spondylosis with myelopathy, lumbar region: Secondary | ICD-10-CM | POA: Diagnosis not present

## 2018-04-23 ENCOUNTER — Ambulatory Visit
Admission: RE | Admit: 2018-04-23 | Discharge: 2018-04-23 | Disposition: A | Discharge status: Home / Self Care | Payer: PPO | Source: Ambulatory Visit | Attending: Obstetrics & Gynecology | Admitting: Obstetrics & Gynecology

## 2018-04-23 ENCOUNTER — Encounter: Payer: Self-pay | Admitting: Obstetrics & Gynecology

## 2018-04-23 DIAGNOSIS — Z1231 Encounter for screening mammogram for malignant neoplasm of breast: Secondary | ICD-10-CM | POA: Insufficient documentation

## 2018-04-23 DIAGNOSIS — Z1239 Encounter for other screening for malignant neoplasm of breast: Secondary | ICD-10-CM

## 2018-06-04 DIAGNOSIS — R609 Edema, unspecified: Secondary | ICD-10-CM | POA: Diagnosis not present

## 2018-06-04 DIAGNOSIS — Z23 Encounter for immunization: Secondary | ICD-10-CM | POA: Diagnosis not present

## 2018-06-04 DIAGNOSIS — E782 Mixed hyperlipidemia: Secondary | ICD-10-CM | POA: Diagnosis not present

## 2018-06-04 DIAGNOSIS — I10 Essential (primary) hypertension: Secondary | ICD-10-CM | POA: Diagnosis not present

## 2018-06-11 DIAGNOSIS — Z79899 Other long term (current) drug therapy: Secondary | ICD-10-CM | POA: Diagnosis not present

## 2018-06-11 DIAGNOSIS — G894 Chronic pain syndrome: Secondary | ICD-10-CM | POA: Diagnosis not present

## 2018-06-11 DIAGNOSIS — M791 Myalgia, unspecified site: Secondary | ICD-10-CM | POA: Diagnosis not present

## 2018-06-11 DIAGNOSIS — Z6837 Body mass index (BMI) 37.0-37.9, adult: Secondary | ICD-10-CM | POA: Diagnosis not present

## 2018-06-11 DIAGNOSIS — Z79891 Long term (current) use of opiate analgesic: Secondary | ICD-10-CM | POA: Diagnosis not present

## 2018-06-11 DIAGNOSIS — M4716 Other spondylosis with myelopathy, lumbar region: Secondary | ICD-10-CM | POA: Diagnosis not present

## 2018-08-11 DIAGNOSIS — M4716 Other spondylosis with myelopathy, lumbar region: Secondary | ICD-10-CM | POA: Diagnosis not present

## 2018-08-11 DIAGNOSIS — M791 Myalgia, unspecified site: Secondary | ICD-10-CM | POA: Diagnosis not present

## 2018-08-11 DIAGNOSIS — M549 Dorsalgia, unspecified: Secondary | ICD-10-CM | POA: Diagnosis not present

## 2018-08-11 DIAGNOSIS — G894 Chronic pain syndrome: Secondary | ICD-10-CM | POA: Diagnosis not present

## 2018-10-08 DIAGNOSIS — M4716 Other spondylosis with myelopathy, lumbar region: Secondary | ICD-10-CM | POA: Diagnosis not present

## 2018-10-08 DIAGNOSIS — G894 Chronic pain syndrome: Secondary | ICD-10-CM | POA: Diagnosis not present

## 2018-10-08 DIAGNOSIS — M791 Myalgia, unspecified site: Secondary | ICD-10-CM | POA: Diagnosis not present

## 2018-10-20 DIAGNOSIS — M25562 Pain in left knee: Secondary | ICD-10-CM | POA: Diagnosis not present

## 2018-10-20 DIAGNOSIS — M1712 Unilateral primary osteoarthritis, left knee: Secondary | ICD-10-CM | POA: Diagnosis not present

## 2018-12-08 DIAGNOSIS — G894 Chronic pain syndrome: Secondary | ICD-10-CM | POA: Diagnosis not present

## 2018-12-08 DIAGNOSIS — Z79891 Long term (current) use of opiate analgesic: Secondary | ICD-10-CM | POA: Diagnosis not present

## 2018-12-08 DIAGNOSIS — Z79899 Other long term (current) drug therapy: Secondary | ICD-10-CM | POA: Diagnosis not present

## 2018-12-08 DIAGNOSIS — M4716 Other spondylosis with myelopathy, lumbar region: Secondary | ICD-10-CM | POA: Diagnosis not present

## 2018-12-08 DIAGNOSIS — M791 Myalgia, unspecified site: Secondary | ICD-10-CM | POA: Diagnosis not present

## 2019-02-06 DIAGNOSIS — M791 Myalgia, unspecified site: Secondary | ICD-10-CM | POA: Diagnosis not present

## 2019-02-06 DIAGNOSIS — M4716 Other spondylosis with myelopathy, lumbar region: Secondary | ICD-10-CM | POA: Diagnosis not present

## 2019-02-06 DIAGNOSIS — G894 Chronic pain syndrome: Secondary | ICD-10-CM | POA: Diagnosis not present

## 2019-03-02 DIAGNOSIS — M1712 Unilateral primary osteoarthritis, left knee: Secondary | ICD-10-CM | POA: Diagnosis not present

## 2019-03-06 DIAGNOSIS — R609 Edema, unspecified: Secondary | ICD-10-CM | POA: Diagnosis not present

## 2019-03-18 ENCOUNTER — Other Ambulatory Visit: Payer: Self-pay | Admitting: Obstetrics & Gynecology

## 2019-03-18 DIAGNOSIS — Z1231 Encounter for screening mammogram for malignant neoplasm of breast: Secondary | ICD-10-CM

## 2019-03-20 ENCOUNTER — Other Ambulatory Visit: Payer: Self-pay | Admitting: Internal Medicine

## 2019-03-20 DIAGNOSIS — R1314 Dysphagia, pharyngoesophageal phase: Secondary | ICD-10-CM | POA: Diagnosis not present

## 2019-03-20 DIAGNOSIS — R1310 Dysphagia, unspecified: Secondary | ICD-10-CM

## 2019-03-20 DIAGNOSIS — E782 Mixed hyperlipidemia: Secondary | ICD-10-CM | POA: Diagnosis not present

## 2019-03-20 DIAGNOSIS — Z23 Encounter for immunization: Secondary | ICD-10-CM | POA: Diagnosis not present

## 2019-03-20 DIAGNOSIS — Z1239 Encounter for other screening for malignant neoplasm of breast: Secondary | ICD-10-CM | POA: Diagnosis not present

## 2019-03-20 DIAGNOSIS — Z1389 Encounter for screening for other disorder: Secondary | ICD-10-CM | POA: Diagnosis not present

## 2019-03-20 DIAGNOSIS — Z0000 Encounter for general adult medical examination without abnormal findings: Secondary | ICD-10-CM | POA: Diagnosis not present

## 2019-03-20 DIAGNOSIS — Z13820 Encounter for screening for osteoporosis: Secondary | ICD-10-CM | POA: Diagnosis not present

## 2019-03-20 DIAGNOSIS — R739 Hyperglycemia, unspecified: Secondary | ICD-10-CM | POA: Diagnosis not present

## 2019-03-20 DIAGNOSIS — I10 Essential (primary) hypertension: Secondary | ICD-10-CM | POA: Diagnosis not present

## 2019-03-24 ENCOUNTER — Other Ambulatory Visit: Payer: Self-pay | Admitting: Internal Medicine

## 2019-03-24 DIAGNOSIS — Z13820 Encounter for screening for osteoporosis: Secondary | ICD-10-CM

## 2019-03-25 ENCOUNTER — Ambulatory Visit
Admission: RE | Admit: 2019-03-25 | Discharge: 2019-03-25 | Disposition: A | Discharge status: Home / Self Care | Payer: PPO | Source: Ambulatory Visit | Attending: Internal Medicine | Admitting: Internal Medicine

## 2019-03-25 ENCOUNTER — Other Ambulatory Visit: Payer: Self-pay | Admitting: Internal Medicine

## 2019-03-25 DIAGNOSIS — R1310 Dysphagia, unspecified: Secondary | ICD-10-CM

## 2019-03-25 DIAGNOSIS — K449 Diaphragmatic hernia without obstruction or gangrene: Secondary | ICD-10-CM | POA: Diagnosis not present

## 2019-03-25 DIAGNOSIS — Z13820 Encounter for screening for osteoporosis: Secondary | ICD-10-CM

## 2019-04-01 ENCOUNTER — Other Ambulatory Visit: Payer: Self-pay

## 2019-04-01 ENCOUNTER — Encounter: Payer: Self-pay | Admitting: Obstetrics & Gynecology

## 2019-04-01 ENCOUNTER — Ambulatory Visit (INDEPENDENT_AMBULATORY_CARE_PROVIDER_SITE_OTHER): Payer: PPO | Admitting: Obstetrics & Gynecology

## 2019-04-01 VITALS — BP 130/80 | Ht 65.0 in | Wt 218.0 lb

## 2019-04-01 DIAGNOSIS — Z1211 Encounter for screening for malignant neoplasm of colon: Secondary | ICD-10-CM

## 2019-04-01 DIAGNOSIS — Z1239 Encounter for other screening for malignant neoplasm of breast: Secondary | ICD-10-CM

## 2019-04-01 DIAGNOSIS — Z01419 Encounter for gynecological examination (general) (routine) without abnormal findings: Secondary | ICD-10-CM | POA: Diagnosis not present
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
## 2019-04-01 NOTE — Patient Instructions (Signed)
PAP every three years Mammogram every year Colonoscopy every 5 years     Colon stool cards today Labs yearly (with PCP)

## 2019-04-02 DIAGNOSIS — M791 Myalgia, unspecified site: Secondary | ICD-10-CM | POA: Diagnosis not present

## 2019-04-02 DIAGNOSIS — M4716 Other spondylosis with myelopathy, lumbar region: Secondary | ICD-10-CM | POA: Diagnosis not present

## 2019-04-02 DIAGNOSIS — Z79899 Other long term (current) drug therapy: Secondary | ICD-10-CM | POA: Diagnosis not present

## 2019-04-02 DIAGNOSIS — G894 Chronic pain syndrome: Secondary | ICD-10-CM | POA: Diagnosis not present

## 2019-04-02 DIAGNOSIS — Z79891 Long term (current) use of opiate analgesic: Secondary | ICD-10-CM | POA: Diagnosis not present

## 2019-04-07 DIAGNOSIS — M1712 Unilateral primary osteoarthritis, left knee: Secondary | ICD-10-CM | POA: Diagnosis not present

## 2019-04-08 DIAGNOSIS — H40003 Preglaucoma, unspecified, bilateral: Secondary | ICD-10-CM | POA: Diagnosis not present

## 2019-04-14 DIAGNOSIS — M1712 Unilateral primary osteoarthritis, left knee: Secondary | ICD-10-CM | POA: Diagnosis not present

## 2019-04-21 DIAGNOSIS — M1712 Unilateral primary osteoarthritis, left knee: Secondary | ICD-10-CM | POA: Diagnosis not present

## 2019-04-21 LAB — FECAL OCCULT BLOOD, IMMUNOCHEMICAL

## 2019-04-21 LAB — SPECIMEN STATUS REPORT

## 2019-04-22 ENCOUNTER — Other Ambulatory Visit: Payer: Self-pay | Admitting: Obstetrics & Gynecology

## 2019-04-22 DIAGNOSIS — Z1211 Encounter for screening for malignant neoplasm of colon: Secondary | ICD-10-CM

## 2019-04-22 NOTE — Progress Notes (Signed)
Pt aware.

## 2019-04-22 NOTE — Progress Notes (Signed)
Let pt know the stool colon screening test needs to be repeated, and she can pick up supplies and requsition here

## 2019-04-27 ENCOUNTER — Ambulatory Visit
Admission: RE | Admit: 2019-04-27 | Discharge: 2019-04-27 | Disposition: A | Discharge status: Home / Self Care | Payer: PPO | Source: Ambulatory Visit | Attending: Obstetrics & Gynecology | Admitting: Obstetrics & Gynecology

## 2019-04-27 ENCOUNTER — Other Ambulatory Visit: Payer: Self-pay | Admitting: Obstetrics & Gynecology

## 2019-04-27 DIAGNOSIS — Z1231 Encounter for screening mammogram for malignant neoplasm of breast: Secondary | ICD-10-CM | POA: Insufficient documentation

## 2019-04-27 DIAGNOSIS — R928 Other abnormal and inconclusive findings on diagnostic imaging of breast: Secondary | ICD-10-CM

## 2019-04-27 DIAGNOSIS — N6489 Other specified disorders of breast: Secondary | ICD-10-CM

## 2019-04-27 NOTE — Progress Notes (Signed)
MMG d/w pt: The results of your recent mammogram reveals an area that needs further magnification views to determine if there is any concern or if it is just a false alarm. There is no suggestion of cancer, just a need for further images and evaluation by the radiologist. They should be contacting you, if not already, to schedule these additional mammogram pictures. If you have any questions, or if they have not yet contacted you, then please give [place] a call at [phone], so that we may help in this process. I know this seems worrisome, yet usually additional xrays clear up any suspicion for breast cancer.   Barnett Applebaum, MD, [facility], [facility] 04/27/2019  4:34 PM

## 2019-05-06 ENCOUNTER — Other Ambulatory Visit: Payer: PPO

## 2019-05-08 DIAGNOSIS — M1712 Unilateral primary osteoarthritis, left knee: Secondary | ICD-10-CM | POA: Diagnosis not present

## 2019-05-10 DIAGNOSIS — Z96651 Presence of right artificial knee joint: Secondary | ICD-10-CM | POA: Insufficient documentation

## 2019-05-10 DIAGNOSIS — M1712 Unilateral primary osteoarthritis, left knee: Secondary | ICD-10-CM | POA: Insufficient documentation

## 2019-05-12 ENCOUNTER — Ambulatory Visit
Admission: RE | Admit: 2019-05-12 | Discharge: 2019-05-12 | Disposition: A | Discharge status: Home / Self Care | Payer: PPO | Source: Ambulatory Visit | Attending: Obstetrics & Gynecology | Admitting: Obstetrics & Gynecology

## 2019-05-12 ENCOUNTER — Encounter: Payer: Self-pay | Admitting: Obstetrics & Gynecology

## 2019-05-12 ENCOUNTER — Other Ambulatory Visit: Payer: Self-pay

## 2019-05-12 DIAGNOSIS — N6489 Other specified disorders of breast: Secondary | ICD-10-CM | POA: Diagnosis not present

## 2019-05-12 DIAGNOSIS — R928 Other abnormal and inconclusive findings on diagnostic imaging of breast: Secondary | ICD-10-CM | POA: Insufficient documentation

## 2019-05-15 ENCOUNTER — Ambulatory Visit
Admission: RE | Admit: 2019-05-15 | Discharge: 2019-05-15 | Disposition: A | Discharge status: Home / Self Care | Payer: PPO | Source: Ambulatory Visit | Attending: Internal Medicine | Admitting: Internal Medicine

## 2019-05-15 ENCOUNTER — Other Ambulatory Visit: Payer: Self-pay

## 2019-05-15 DIAGNOSIS — Z780 Asymptomatic menopausal state: Secondary | ICD-10-CM | POA: Diagnosis not present

## 2019-05-15 DIAGNOSIS — Z13820 Encounter for screening for osteoporosis: Secondary | ICD-10-CM | POA: Insufficient documentation

## 2019-05-15 DIAGNOSIS — M8580 Other specified disorders of bone density and structure, unspecified site: Secondary | ICD-10-CM | POA: Diagnosis not present

## 2019-05-15 DIAGNOSIS — M85852 Other specified disorders of bone density and structure, left thigh: Secondary | ICD-10-CM | POA: Diagnosis not present

## 2019-05-15 DIAGNOSIS — J45909 Unspecified asthma, uncomplicated: Secondary | ICD-10-CM | POA: Insufficient documentation

## 2019-05-15 DIAGNOSIS — Z96651 Presence of right artificial knee joint: Secondary | ICD-10-CM | POA: Diagnosis not present

## 2019-06-03 DIAGNOSIS — M4716 Other spondylosis with myelopathy, lumbar region: Secondary | ICD-10-CM | POA: Diagnosis not present

## 2019-06-03 DIAGNOSIS — M791 Myalgia, unspecified site: Secondary | ICD-10-CM | POA: Diagnosis not present

## 2019-06-03 DIAGNOSIS — G894 Chronic pain syndrome: Secondary | ICD-10-CM | POA: Diagnosis not present

## 2019-06-07 NOTE — Discharge Instructions (Signed)
°  Instructions after Total Knee Replacement ° ° Kha Hari P. Nyellie Yetter, Jr., M.D.    ° Dept. of Orthopaedics & Sports Medicine ° [facility] ° [address] ° [place], [place]  [address] ° Phone: [phone]   Fax: [phone] ° °  °DIET: °• Drink plenty of non-alcoholic fluids. °• Resume your normal diet. Include foods high in fiber. ° °ACTIVITY:  °• You may use crutches or a walker with weight-bearing as tolerated, unless instructed otherwise. °• You may be weaned off of the walker or crutches by your Physical Therapist.  °• Do NOT place pillows under the knee. Anything placed under the knee could limit your ability to straighten the knee.   °• Continue doing gentle exercises. Exercising will reduce the pain and swelling, increase motion, and prevent muscle weakness.   °• Please continue to use the TED compression stockings for 6 weeks. You may remove the stockings at night, but should reapply them in the morning. °• Do not drive or operate any equipment until instructed. ° °WOUND CARE:  °• Continue to use the PolarCare or ice packs periodically to reduce pain and swelling. °• You may bathe or shower after the staples are removed at the first office visit following surgery. ° °MEDICATIONS: °• You may resume your regular medications. °• Please take the pain medication as prescribed on the medication. °• Do not take pain medication on an empty stomach. °• You have been given a prescription for a blood thinner (Lovenox or Coumadin). Please take the medication as instructed. (NOTE: After completing a 2 week course of Lovenox, take one Enteric-coated aspirin once a day. This along with elevation will help reduce the possibility of phlebitis in your operated leg.) °• Do not drive or drink alcoholic beverages when taking pain medications. ° °CALL THE OFFICE FOR: °• Temperature above 101 degrees °• Excessive bleeding or drainage on the dressing. °• Excessive swelling, coldness, or paleness of the toes. °• Persistent  nausea and vomiting. ° °FOLLOW-UP:  °• You should have an appointment to return to the office in 10-14 days after surgery. °• Arrangements have been made for continuation of Physical Therapy (either home therapy or outpatient therapy). °  °

## 2019-06-09 ENCOUNTER — Other Ambulatory Visit: Payer: Self-pay

## 2019-06-09 ENCOUNTER — Other Ambulatory Visit
Admission: RE | Admit: 2019-06-09 | Discharge: 2019-06-09 | Disposition: A | Discharge status: Home / Self Care | Payer: PPO | Source: Ambulatory Visit | Attending: Orthopedic Surgery | Admitting: Orthopedic Surgery

## 2019-06-09 DIAGNOSIS — Z01818 Encounter for other preprocedural examination: Secondary | ICD-10-CM | POA: Diagnosis not present

## 2019-06-09 HISTORY — DX: Displaced fracture of capitate (os magnum) bone, left wrist, initial encounter for closed fracture: S62.132A

## 2019-06-09 HISTORY — DX: Personal history of urinary calculi: Z87.442

## 2019-06-09 HISTORY — DX: Other fracture of unspecified lower leg, initial encounter for closed fracture: S82.899A

## 2019-06-09 LAB — URINALYSIS, ROUTINE W REFLEX MICROSCOPIC
Bilirubin Urine: NEGATIVE
Glucose, UA: NEGATIVE mg/dL
Ketones, ur: NEGATIVE mg/dL
Leukocytes,Ua: NEGATIVE
Nitrite: NEGATIVE
Protein, ur: NEGATIVE mg/dL
Specific Gravity, Urine: 1.013 (ref 1.005–1.030)
pH: 5 (ref 5.0–8.0)

## 2019-06-09 LAB — COMPREHENSIVE METABOLIC PANEL
ALT: 29 U/L (ref 0–44)
AST: 25 U/L (ref 15–41)
Albumin: 4.6 g/dL (ref 3.5–5.0)
Alkaline Phosphatase: 79 U/L (ref 38–126)
Anion gap: 10 (ref 5–15)
BUN: 14 mg/dL (ref 8–23)
CO2: 28 mmol/L (ref 22–32)
Calcium: 10 mg/dL (ref 8.9–10.3)
Chloride: 103 mmol/L (ref 98–111)
Creatinine, Ser: 0.56 mg/dL (ref 0.44–1.00)
GFR calc Af Amer: >60 mL/min (ref >60)
GFR calc non Af Amer: >60 mL/min (ref >60)
Glucose, Bld: 93 mg/dL (ref 70–99)
Potassium: 4 mmol/L (ref 3.5–5.1)
Sodium: 141 mmol/L (ref 135–145)
Total Bilirubin: 0.6 mg/dL (ref 0.3–1.2)
Total Protein: 8.1 g/dL (ref 6.5–8.1)

## 2019-06-09 LAB — CBC
HCT: 41.8 % (ref 36.0–46.0)
Hemoglobin: 13.6 g/dL (ref 12.0–15.0)
MCH: 31.9 pg (ref 26.0–34.0)
MCHC: 32.5 g/dL (ref 30.0–36.0)
MCV: 98.1 fL (ref 80.0–100.0)
Platelets: 260 10*3/uL (ref 150–400)
RBC: 4.26 MIL/uL (ref 3.87–5.11)
RDW: 12.3 % (ref 11.5–15.5)
WBC: 7 10*3/uL (ref 4.0–10.5)
nRBC: 0 % (ref 0.0–0.2)

## 2019-06-09 LAB — SEDIMENTATION RATE: Sed Rate: 34 mm/hr — ABNORMAL HIGH (ref 0–30)

## 2019-06-09 LAB — C-REACTIVE PROTEIN: CRP: 1.1 mg/dL — ABNORMAL HIGH (ref <1.0)

## 2019-06-09 LAB — SURGICAL PCR SCREEN
MRSA, PCR: NEGATIVE
Staphylococcus aureus: POSITIVE — AB

## 2019-06-09 LAB — PROTIME-INR
INR: 1 (ref 0.8–1.2)
Prothrombin Time: 12.7 seconds (ref 11.4–15.2)

## 2019-06-09 LAB — APTT: aPTT: 29 seconds (ref 24–36)

## 2019-06-09 MED ORDER — ENSURE PRE-SURGERY PO LIQD
296.0000 mL | Freq: Once | ORAL | Status: DC
  Filled 2019-06-09: qty 296

## 2019-06-09 NOTE — Patient Instructions (Signed)
Your procedure is scheduled on: Mon 9/21 Report to Day Surgery. To find out your arrival time please call [phone] between 1PM - 3PM on Friday 9/18.  Remember: Instructions that are not followed completely may result in serious medical risk,  up to and including death, or upon the discretion of your surgeon and anesthesiologist your  surgery may need to be rescheduled.     _X__ 1. Do not eat food after midnight the night before your procedure.                 No gum chewing or hard candies. You may drink clear liquids up to 2 hours                 before you are scheduled to arrive for your surgery- DO not drink clear                 liquids within 2 hours of the start of your surgery.                 Clear Liquids include:  water, apple juice without pulp, complete clear carbohydrate                 drink  2 hours before you arrive, Gatorade, Black Coffee or Tea (Do not add                 anything to coffee or tea).  __X__2.  On the morning of surgery brush your teeth with toothpaste and water, you                may rinse your mouth with mouthwash if you wish.  Do not swallow any toothpaste of mouthwash.     ___ 3.  No Alcohol for 24 hours before or after surgery.   ___ 4.  Do Not Smoke or use e-cigarettes For 24 Hours Prior to Your Surgery.                 Do not use any chewable tobacco products for at least 6 hours prior to                 surgery.  ____  5.  Bring all medications with you on the day of surgery if instructed.   __x__  6.  Notify your doctor if there is any change in your medical condition      (cold, fever, infections).     Do not wear jewelry, make-up, hairpins, clips or nail polish. Do not wear lotions, powders, or perfumes. You may wear deodorant. Do not shave 48 hours prior to surgery. Men may shave face and neck. Do not bring valuables to the hospital.    [facility] is not responsible for any belongings or  valuables.  Contacts, dentures or bridgework may not be worn into surgery. Leave your suitcase in the car. After surgery it may be brought to your room. For patients admitted to the hospital, discharge time is determined by your treatment team.   Patients discharged the day of surgery will not be allowed to drive home.   Please read over the following fact sheets that you were given:     _x___ Take these medicines the morning of surgery with A SIP OF WATER:    1. amLODipine (NORVASC) 10 MG tablet  2. gabapentin (NEURONTIN) 300 MG capsule  3. HYDROcodone-acetaminophen (NORCO) 10-325 MG tablet  4.tiZANidine (ZANAFLEX) 4 MG tablet  5.  6.  ____  Fleet Enema (as directed)   __x__ Use CHG Soap as directed  ____ Use inhalers on the day of surgery  ____ Stop metformin 2 days prior to surgery    ____ Take 1/2 of usual insulin dose the night before surgery. No insulin the morning          of surgery.   ____ Stop Coumadin/Plavix/aspirin on   _x___ Stop Anti-inflammatories meloxicam (MOBIC) 15 MG tablet aleve ibuprofen or Aspirin today   __x__ Stop supplements until after surgery.  BLACK COHOSH PO  ____ Bring C-Pap to the hospital.

## 2019-06-10 LAB — TYPE AND SCREEN
ABO/RH(D): A POS
Antibody Screen: NEGATIVE

## 2019-06-10 NOTE — Pre-Procedure Instructions (Signed)
CRP results sent to Dr. Hooten for review. 

## 2019-06-11 ENCOUNTER — Other Ambulatory Visit: Payer: Self-pay

## 2019-06-11 ENCOUNTER — Other Ambulatory Visit
Admission: RE | Admit: 2019-06-11 | Discharge: 2019-06-11 | Disposition: A | Discharge status: Home / Self Care | Payer: PPO | Source: Ambulatory Visit | Attending: Orthopedic Surgery | Admitting: Orthopedic Surgery

## 2019-06-11 DIAGNOSIS — Z01812 Encounter for preprocedural laboratory examination: Secondary | ICD-10-CM | POA: Diagnosis not present

## 2019-06-11 DIAGNOSIS — Z20828 Contact with and (suspected) exposure to other viral communicable diseases: Secondary | ICD-10-CM | POA: Insufficient documentation

## 2019-06-11 LAB — URINE CULTURE
Culture: >=100000 — AB
Special Requests: NORMAL

## 2019-06-11 LAB — IGE: IgE (Immunoglobulin E), Serum: <2 IU/mL — ABNORMAL LOW (ref 6–495)

## 2019-06-11 LAB — SARS CORONAVIRUS 2 (TAT 6-24 HRS): SARS Coronavirus 2: NEGATIVE

## 2019-06-14 MED ORDER — TRANEXAMIC ACID-NACL 1000-0.7 MG/100ML-% IV SOLN
1000.0000 mg | INTRAVENOUS | Status: DC
  Filled 2019-06-14: qty 100

## 2019-06-14 MED ORDER — CLINDAMYCIN PHOSPHATE 900 MG/50ML IV SOLN: 900.0000 mg | INTRAVENOUS | Status: DC

## 2019-06-15 ENCOUNTER — Inpatient Hospital Stay: Payer: PPO

## 2019-06-15 ENCOUNTER — Encounter: Payer: Self-pay | Admitting: Orthopedic Surgery

## 2019-06-15 ENCOUNTER — Encounter
Admission: RE | Disposition: A | Discharge status: Home Health | Payer: Self-pay | Source: Home / Self Care | Attending: Orthopedic Surgery

## 2019-06-15 ENCOUNTER — Other Ambulatory Visit: Payer: Self-pay

## 2019-06-15 ENCOUNTER — Inpatient Hospital Stay
Admission: RE | Admit: 2019-06-15 | Discharge: 2019-06-17 | DRG: 470 | Disposition: A | Discharge status: Home Health | Payer: PPO | Attending: Orthopedic Surgery | Admitting: Orthopedic Surgery

## 2019-06-15 ENCOUNTER — Inpatient Hospital Stay: Payer: PPO | Admitting: Anesthesiology

## 2019-06-15 DIAGNOSIS — Z87891 Personal history of nicotine dependence: Secondary | ICD-10-CM | POA: Diagnosis not present

## 2019-06-15 DIAGNOSIS — Z79899 Other long term (current) drug therapy: Secondary | ICD-10-CM

## 2019-06-15 DIAGNOSIS — Z8541 Personal history of malignant neoplasm of cervix uteri: Secondary | ICD-10-CM

## 2019-06-15 DIAGNOSIS — M1712 Unilateral primary osteoarthritis, left knee: Principal | ICD-10-CM | POA: Diagnosis present

## 2019-06-15 DIAGNOSIS — I10 Essential (primary) hypertension: Secondary | ICD-10-CM | POA: Diagnosis not present

## 2019-06-15 DIAGNOSIS — Z96659 Presence of unspecified artificial knee joint: Secondary | ICD-10-CM

## 2019-06-15 DIAGNOSIS — Z471 Aftercare following joint replacement surgery: Secondary | ICD-10-CM | POA: Diagnosis not present

## 2019-06-15 DIAGNOSIS — J45909 Unspecified asthma, uncomplicated: Secondary | ICD-10-CM | POA: Diagnosis not present

## 2019-06-15 DIAGNOSIS — Z96652 Presence of left artificial knee joint: Secondary | ICD-10-CM | POA: Diagnosis not present

## 2019-06-15 HISTORY — PX: KNEE ARTHROPLASTY: SHX992

## 2019-06-15 SURGERY — ARTHROPLASTY, KNEE, TOTAL, USING IMAGELESS COMPUTER-ASSISTED NAVIGATION
Anesthesia: General | Site: Knee | Laterality: Left

## 2019-06-15 MED ORDER — SUGAMMADEX SODIUM 200 MG/2ML IV SOLN
INTRAVENOUS | Status: AC
  Filled 2019-06-15: qty 2

## 2019-06-15 MED ORDER — ACETAMINOPHEN 325 MG PO TABS: 325.0000 mg | ORAL_TABLET | Freq: Four times a day (QID) | ORAL | Status: DC | PRN

## 2019-06-15 MED ORDER — AMLODIPINE BESYLATE 10 MG PO TABS
10.0000 mg | ORAL_TABLET | Freq: Every day | ORAL | Status: DC
  Administered 2019-06-17: 10 mg via ORAL
  Filled 2019-06-15 (×2): qty 1

## 2019-06-15 MED ORDER — SENNOSIDES-DOCUSATE SODIUM 8.6-50 MG PO TABS
1.0000 | ORAL_TABLET | Freq: Two times a day (BID) | ORAL | Status: DC
  Administered 2019-06-16 – 2019-06-17 (×4): 1 via ORAL
  Filled 2019-06-15 (×4): qty 1

## 2019-06-15 MED ORDER — PHENOL 1.4 % MT LIQD
1.0000 | OROMUCOSAL | Status: DC | PRN
  Filled 2019-06-15: qty 177

## 2019-06-15 MED ORDER — NEOMYCIN-POLYMYXIN B GU 40-200000 IR SOLN
Status: DC | PRN
  Administered 2019-06-15: 14 mL

## 2019-06-15 MED ORDER — CLINDAMYCIN PHOSPHATE 900 MG/50ML IV SOLN
INTRAVENOUS | Status: AC
  Filled 2019-06-15: qty 50

## 2019-06-15 MED ORDER — ONDANSETRON HCL 4 MG PO TABS: 4.0000 mg | ORAL_TABLET | Freq: Four times a day (QID) | ORAL | Status: DC | PRN

## 2019-06-15 MED ORDER — OXYCODONE HCL 5 MG PO TABS
ORAL_TABLET | ORAL | Status: AC
  Administered 2019-06-15: 5 mg via ORAL
  Filled 2019-06-15: qty 1

## 2019-06-15 MED ORDER — DEXAMETHASONE SODIUM PHOSPHATE 10 MG/ML IJ SOLN
8.0000 mg | Freq: Once | INTRAMUSCULAR | Status: AC
  Administered 2019-06-15: 10:00:00 8 mg via INTRAVENOUS

## 2019-06-15 MED ORDER — DEXAMETHASONE SODIUM PHOSPHATE 10 MG/ML IJ SOLN
INTRAMUSCULAR | Status: AC
  Administered 2019-06-15: 8 mg via INTRAVENOUS
  Filled 2019-06-15: qty 1

## 2019-06-15 MED ORDER — OXYCODONE HCL 5 MG PO TABS
5.0000 mg | ORAL_TABLET | Freq: Once | ORAL | Status: AC | PRN
  Administered 2019-06-15: 17:00:00 5 mg via ORAL

## 2019-06-15 MED ORDER — BUPIVACAINE HCL (PF) 0.25 % IJ SOLN
INTRAMUSCULAR | Status: AC
  Filled 2019-06-15: qty 60

## 2019-06-15 MED ORDER — ACETAMINOPHEN 10 MG/ML IV SOLN
INTRAVENOUS | Status: AC
  Filled 2019-06-15: qty 100

## 2019-06-15 MED ORDER — FLEET ENEMA 7-19 GM/118ML RE ENEM: 1.0000 | ENEMA | Freq: Once | RECTAL | Status: DC | PRN

## 2019-06-15 MED ORDER — DIPHENHYDRAMINE HCL 12.5 MG/5ML PO ELIX: 12.5000 mg | ORAL_SOLUTION | ORAL | Status: DC | PRN

## 2019-06-15 MED ORDER — METOCLOPRAMIDE HCL 5 MG/ML IJ SOLN: 5.0000 mg | Freq: Three times a day (TID) | INTRAMUSCULAR | Status: DC | PRN

## 2019-06-15 MED ORDER — HYDROMORPHONE HCL 1 MG/ML IJ SOLN
INTRAMUSCULAR | Status: AC
  Administered 2019-06-15: 0.25 mg via INTRAVENOUS
  Filled 2019-06-15: qty 1

## 2019-06-15 MED ORDER — ROCURONIUM BROMIDE 50 MG/5ML IV SOLN
INTRAVENOUS | Status: AC
  Filled 2019-06-15: qty 2

## 2019-06-15 MED ORDER — HYDROMORPHONE HCL 1 MG/ML IJ SOLN: 0.5000 mg | INTRAMUSCULAR | Status: DC | PRN

## 2019-06-15 MED ORDER — FENTANYL CITRATE (PF) 100 MCG/2ML IJ SOLN
INTRAMUSCULAR | Status: DC | PRN
  Administered 2019-06-15 (×2): 100 ug via INTRAVENOUS
  Administered 2019-06-15: 50 ug via INTRAVENOUS
  Administered 2019-06-15: 100 ug via INTRAVENOUS
  Administered 2019-06-15: 50 ug via INTRAVENOUS
  Administered 2019-06-15: 100 ug via INTRAVENOUS

## 2019-06-15 MED ORDER — CELECOXIB 200 MG PO CAPS
ORAL_CAPSULE | ORAL | Status: AC
  Administered 2019-06-15: 400 mg via ORAL
  Filled 2019-06-15: qty 2

## 2019-06-15 MED ORDER — ADULT MULTIVITAMIN W/MINERALS CH
1.0000 | ORAL_TABLET | Freq: Every day | ORAL | Status: DC
  Administered 2019-06-16 – 2019-06-17 (×2): 1 via ORAL
  Filled 2019-06-15 (×2): qty 1

## 2019-06-15 MED ORDER — PANTOPRAZOLE SODIUM 40 MG PO TBEC
40.0000 mg | DELAYED_RELEASE_TABLET | Freq: Two times a day (BID) | ORAL | Status: DC
  Administered 2019-06-15 – 2019-06-17 (×4): 40 mg via ORAL
  Filled 2019-06-15 (×4): qty 1

## 2019-06-15 MED ORDER — SODIUM CHLORIDE 0.9 % IV SOLN
INTRAVENOUS | Status: DC
  Administered 2019-06-15: 18:00:00 via INTRAVENOUS

## 2019-06-15 MED ORDER — PROPOFOL 10 MG/ML IV BOLUS
INTRAVENOUS | Status: DC | PRN
  Administered 2019-06-15: 150 mg via INTRAVENOUS

## 2019-06-15 MED ORDER — CELECOXIB 200 MG PO CAPS
400.0000 mg | ORAL_CAPSULE | Freq: Once | ORAL | Status: AC
  Administered 2019-06-15: 10:00:00 400 mg via ORAL

## 2019-06-15 MED ORDER — LACTATED RINGERS IV SOLN
INTRAVENOUS | Status: DC
  Administered 2019-06-15 (×2): via INTRAVENOUS

## 2019-06-15 MED ORDER — TRAMADOL HCL 50 MG PO TABS
50.0000 mg | ORAL_TABLET | ORAL | Status: DC | PRN
  Administered 2019-06-15 – 2019-06-16 (×4): 100 mg via ORAL
  Administered 2019-06-17: 50 mg via ORAL
  Filled 2019-06-15: qty 1
  Filled 2019-06-15 (×4): qty 2

## 2019-06-15 MED ORDER — FENTANYL CITRATE (PF) 250 MCG/5ML IJ SOLN
INTRAMUSCULAR | Status: AC
  Filled 2019-06-15: qty 5

## 2019-06-15 MED ORDER — OXYCODONE HCL 5 MG PO TABS
10.0000 mg | ORAL_TABLET | ORAL | Status: DC | PRN
  Administered 2019-06-15 – 2019-06-17 (×5): 10 mg via ORAL
  Filled 2019-06-15 (×4): qty 2

## 2019-06-15 MED ORDER — MENTHOL 3 MG MT LOZG
1.0000 | LOZENGE | OROMUCOSAL | Status: DC | PRN
  Filled 2019-06-15: qty 9

## 2019-06-15 MED ORDER — ONDANSETRON HCL 4 MG/2ML IJ SOLN: 4.0000 mg | Freq: Four times a day (QID) | INTRAMUSCULAR | Status: DC | PRN

## 2019-06-15 MED ORDER — MIDAZOLAM HCL 2 MG/2ML IJ SOLN
INTRAMUSCULAR | Status: DC | PRN
  Administered 2019-06-15: 2 mg via INTRAVENOUS

## 2019-06-15 MED ORDER — SODIUM CHLORIDE 0.9 % IV SOLN
INTRAVENOUS | Status: DC | PRN
  Administered 2019-06-15: 15:00:00 60 mL

## 2019-06-15 MED ORDER — METOCLOPRAMIDE HCL 10 MG PO TABS
10.0000 mg | ORAL_TABLET | Freq: Three times a day (TID) | ORAL | Status: DC
  Administered 2019-06-15 – 2019-06-17 (×7): 10 mg via ORAL
  Filled 2019-06-15 (×8): qty 1

## 2019-06-15 MED ORDER — FENTANYL CITRATE (PF) 100 MCG/2ML IJ SOLN
INTRAMUSCULAR | Status: AC
  Administered 2019-06-15: 50 ug via INTRAVENOUS
  Filled 2019-06-15: qty 2

## 2019-06-15 MED ORDER — TRANEXAMIC ACID-NACL 1000-0.7 MG/100ML-% IV SOLN
1000.0000 mg | Freq: Once | INTRAVENOUS | Status: AC
  Administered 2019-06-15: 1000 mg via INTRAVENOUS
  Filled 2019-06-15: qty 100

## 2019-06-15 MED ORDER — PHENYLEPHRINE HCL (PRESSORS) 10 MG/ML IV SOLN
INTRAVENOUS | Status: DC | PRN
  Administered 2019-06-15: 100 ug via INTRAVENOUS

## 2019-06-15 MED ORDER — METOCLOPRAMIDE HCL 10 MG PO TABS: 5.0000 mg | ORAL_TABLET | Freq: Three times a day (TID) | ORAL | Status: DC | PRN

## 2019-06-15 MED ORDER — CHLORHEXIDINE GLUCONATE 4 % EX LIQD: 60.0000 mL | Freq: Once | CUTANEOUS | Status: DC

## 2019-06-15 MED ORDER — ROCURONIUM BROMIDE 100 MG/10ML IV SOLN
INTRAVENOUS | Status: DC | PRN
  Administered 2019-06-15: 30 mg via INTRAVENOUS
  Administered 2019-06-15: 70 mg via INTRAVENOUS

## 2019-06-15 MED ORDER — SUGAMMADEX SODIUM 200 MG/2ML IV SOLN
INTRAVENOUS | Status: DC | PRN
  Administered 2019-06-15: 200 mg via INTRAVENOUS

## 2019-06-15 MED ORDER — GABAPENTIN 300 MG PO CAPS
300.0000 mg | ORAL_CAPSULE | Freq: Two times a day (BID) | ORAL | Status: DC
  Administered 2019-06-15 – 2019-06-17 (×4): 300 mg via ORAL
  Filled 2019-06-15 (×4): qty 1

## 2019-06-15 MED ORDER — NEOMYCIN-POLYMYXIN B GU 40-200000 IR SOLN
Status: AC
  Filled 2019-06-15: qty 20

## 2019-06-15 MED ORDER — FAMOTIDINE 20 MG PO TABS
ORAL_TABLET | ORAL | Status: AC
  Administered 2019-06-15: 10:00:00 20 mg via ORAL
  Filled 2019-06-15: qty 1

## 2019-06-15 MED ORDER — FAMOTIDINE 20 MG PO TABS
20.0000 mg | ORAL_TABLET | Freq: Once | ORAL | Status: AC
  Administered 2019-06-15: 10:00:00 20 mg via ORAL

## 2019-06-15 MED ORDER — BUPIVACAINE HCL (PF) 0.25 % IJ SOLN
INTRAMUSCULAR | Status: DC | PRN
  Administered 2019-06-15: 60 mL

## 2019-06-15 MED ORDER — CLINDAMYCIN PHOSPHATE 900 MG/50ML IV SOLN
INTRAVENOUS | Status: DC | PRN
  Administered 2019-06-15: 900 mg via INTRAVENOUS

## 2019-06-15 MED ORDER — CLINDAMYCIN PHOSPHATE 600 MG/50ML IV SOLN
600.0000 mg | Freq: Four times a day (QID) | INTRAVENOUS | Status: AC
  Administered 2019-06-15 – 2019-06-16 (×4): 600 mg via INTRAVENOUS
  Filled 2019-06-15 (×5): qty 50

## 2019-06-15 MED ORDER — TRANEXAMIC ACID-NACL 1000-0.7 MG/100ML-% IV SOLN
INTRAVENOUS | Status: DC | PRN
  Administered 2019-06-15: 1000 mg via INTRAVENOUS

## 2019-06-15 MED ORDER — GABAPENTIN 300 MG PO CAPS
300.0000 mg | ORAL_CAPSULE | Freq: Once | ORAL | Status: AC
  Administered 2019-06-15: 300 mg via ORAL

## 2019-06-15 MED ORDER — HYDROMORPHONE HCL 1 MG/ML IJ SOLN
0.2500 mg | INTRAMUSCULAR | Status: DC | PRN
  Administered 2019-06-15 (×4): 0.25 mg via INTRAVENOUS

## 2019-06-15 MED ORDER — CALCIUM CARBONATE-VITAMIN D 500-200 MG-UNIT PO TABS
1.0000 | ORAL_TABLET | Freq: Every day | ORAL | Status: DC
  Administered 2019-06-16 – 2019-06-17 (×2): 1 via ORAL
  Filled 2019-06-15 (×2): qty 1

## 2019-06-15 MED ORDER — ONDANSETRON HCL 4 MG/2ML IJ SOLN
INTRAMUSCULAR | Status: AC
  Filled 2019-06-15: qty 2

## 2019-06-15 MED ORDER — BUPIVACAINE LIPOSOME 1.3 % IJ SUSP
INTRAMUSCULAR | Status: AC
  Filled 2019-06-15: qty 20

## 2019-06-15 MED ORDER — MAGNESIUM HYDROXIDE 400 MG/5ML PO SUSP
30.0000 mL | Freq: Every day | ORAL | Status: DC
  Administered 2019-06-16 – 2019-06-17 (×2): 30 mL via ORAL
  Filled 2019-06-15 (×2): qty 30

## 2019-06-15 MED ORDER — MIDAZOLAM HCL 2 MG/2ML IJ SOLN
INTRAMUSCULAR | Status: AC
  Filled 2019-06-15: qty 2

## 2019-06-15 MED ORDER — OXYCODONE HCL 5 MG/5ML PO SOLN: 5.0000 mg | Freq: Once | ORAL | Status: AC | PRN

## 2019-06-15 MED ORDER — ONDANSETRON HCL 4 MG/2ML IJ SOLN
INTRAMUSCULAR | Status: DC | PRN
  Administered 2019-06-15: 4 mg via INTRAVENOUS

## 2019-06-15 MED ORDER — ACETAMINOPHEN 10 MG/ML IV SOLN
1000.0000 mg | Freq: Four times a day (QID) | INTRAVENOUS | Status: AC
  Administered 2019-06-15 – 2019-06-16 (×4): 1000 mg via INTRAVENOUS
  Filled 2019-06-15 (×4): qty 100

## 2019-06-15 MED ORDER — CELECOXIB 200 MG PO CAPS
200.0000 mg | ORAL_CAPSULE | Freq: Two times a day (BID) | ORAL | Status: DC
  Administered 2019-06-15 – 2019-06-17 (×4): 200 mg via ORAL
  Filled 2019-06-15 (×4): qty 1

## 2019-06-15 MED ORDER — SODIUM CHLORIDE FLUSH 0.9 % IV SOLN
INTRAVENOUS | Status: AC
  Filled 2019-06-15: qty 40

## 2019-06-15 MED ORDER — FENTANYL CITRATE (PF) 100 MCG/2ML IJ SOLN
25.0000 ug | INTRAMUSCULAR | Status: DC | PRN
  Administered 2019-06-15 (×2): 50 ug via INTRAVENOUS

## 2019-06-15 MED ORDER — LIDOCAINE HCL (CARDIAC) PF 100 MG/5ML IV SOSY
PREFILLED_SYRINGE | INTRAVENOUS | Status: DC | PRN
  Administered 2019-06-15: 100 mg via INTRAVENOUS

## 2019-06-15 MED ORDER — PROPOFOL 10 MG/ML IV BOLUS
INTRAVENOUS | Status: AC
  Filled 2019-06-15: qty 20

## 2019-06-15 MED ORDER — TIZANIDINE HCL 4 MG PO TABS
4.0000 mg | ORAL_TABLET | Freq: Two times a day (BID) | ORAL | Status: DC
  Administered 2019-06-15 – 2019-06-17 (×4): 4 mg via ORAL
  Filled 2019-06-15 (×5): qty 1

## 2019-06-15 MED ORDER — OXYCODONE HCL 5 MG PO TABS
5.0000 mg | ORAL_TABLET | ORAL | Status: DC | PRN
  Administered 2019-06-17: 5 mg via ORAL
  Filled 2019-06-15 (×3): qty 1

## 2019-06-15 MED ORDER — LIDOCAINE HCL (PF) 2 % IJ SOLN
INTRAMUSCULAR | Status: AC
  Filled 2019-06-15: qty 10

## 2019-06-15 MED ORDER — ALUM & MAG HYDROXIDE-SIMETH 200-200-20 MG/5ML PO SUSP: 30.0000 mL | ORAL | Status: DC | PRN

## 2019-06-15 MED ORDER — ACETAMINOPHEN 10 MG/ML IV SOLN
INTRAVENOUS | Status: DC | PRN
  Administered 2019-06-15: 1000 mg via INTRAVENOUS

## 2019-06-15 MED ORDER — BISACODYL 10 MG RE SUPP: 10.0000 mg | Freq: Every day | RECTAL | Status: DC | PRN

## 2019-06-15 MED ORDER — ENOXAPARIN SODIUM 30 MG/0.3ML ~~LOC~~ SOLN
30.0000 mg | Freq: Two times a day (BID) | SUBCUTANEOUS | Status: DC
  Administered 2019-06-16 – 2019-06-17 (×3): 30 mg via SUBCUTANEOUS
  Filled 2019-06-15 (×3): qty 0.3

## 2019-06-15 MED ORDER — FERROUS SULFATE 325 (65 FE) MG PO TABS
325.0000 mg | ORAL_TABLET | Freq: Two times a day (BID) | ORAL | Status: DC
  Administered 2019-06-16 – 2019-06-17 (×3): 325 mg via ORAL
  Filled 2019-06-15 (×3): qty 1

## 2019-06-15 SURGICAL SUPPLY — 67 items
ATTUNE PSFEM LTSZ5 NARCEM KNEE (Femur) ×2 IMPLANT
ATTUNE PSRP INSR SZ5 5 KNEE (Insert) ×1 IMPLANT
ATTUNE PSRP INSR SZ5 5MM KNEE (Insert) ×1 IMPLANT
BASEPLATE TIBIAL ROTATING SZ 4 (Knees) ×2 IMPLANT
BATTERY INSTRU NAVIGATION (MISCELLANEOUS) ×12 IMPLANT
BLADE SAW 70X12.5 (BLADE) ×3 IMPLANT
BLADE SAW 90X13X1.19 OSCILLAT (BLADE) ×3 IMPLANT
BLADE SAW 90X25X1.19 OSCILLAT (BLADE) ×3 IMPLANT
CANISTER SUCT 3000ML PPV (MISCELLANEOUS) ×3 IMPLANT
CEMENT HV SMART SET (Cement) ×6 IMPLANT
COOLER POLAR GLACIER W/PUMP (MISCELLANEOUS) ×3 IMPLANT
COVER WAND RF STERILE (DRAPES) ×3 IMPLANT
CUFF TOURN SGL QUICK 24 (TOURNIQUET CUFF)
CUFF TOURN SGL QUICK 30 (TOURNIQUET CUFF) ×2
CUFF TRNQT CYL 24X4X16.5-23 (TOURNIQUET CUFF) IMPLANT
CUFF TRNQT CYL 30X4X21-28X (TOURNIQUET CUFF) IMPLANT
DRAPE 3/4 80X56 (DRAPES) ×3 IMPLANT
DRSG DERMACEA 8X12 NADH (GAUZE/BANDAGES/DRESSINGS) ×3 IMPLANT
DRSG OPSITE POSTOP 4X14 (GAUZE/BANDAGES/DRESSINGS) ×3 IMPLANT
DRSG TEGADERM 4X4.75 (GAUZE/BANDAGES/DRESSINGS) ×3 IMPLANT
DURAPREP 26ML APPLICATOR (WOUND CARE) ×4 IMPLANT
ELECT REM PT RETURN 9FT ADLT (ELECTROSURGICAL) ×3
ELECTRODE REM PT RTRN 9FT ADLT (ELECTROSURGICAL) ×1 IMPLANT
EX-PIN ORTHOLOCK NAV 4X150 (PIN) ×6 IMPLANT
GLOVE BIOGEL M STRL SZ7.5 (GLOVE) ×6 IMPLANT
GLOVE INDICATOR 8.0 STRL GRN (GLOVE) ×3 IMPLANT
GOWN STRL REUS W/ TWL LRG LVL3 (GOWN DISPOSABLE) ×2 IMPLANT
GOWN STRL REUS W/TWL LRG LVL3 (GOWN DISPOSABLE) ×4
HEMOVAC 400CC 10FR (MISCELLANEOUS) ×3 IMPLANT
HOLDER FOLEY CATH W/STRAP (MISCELLANEOUS) ×3 IMPLANT
HOOD PEEL AWAY FLYTE STAYCOOL (MISCELLANEOUS) ×6 IMPLANT
KIT TURNOVER KIT A (KITS) ×3 IMPLANT
KNIFE SCULPS 14X20 (INSTRUMENTS) ×3 IMPLANT
LABEL OR SOLS (LABEL) ×3 IMPLANT
MANIFOLD NEPTUNE II (INSTRUMENTS) ×3 IMPLANT
NDL SAFETY ECLIPSE 18X1.5 (NEEDLE) ×1 IMPLANT
NDL SPNL 20GX3.5 QUINCKE YW (NEEDLE) ×2 IMPLANT
NEEDLE HYPO 18GX1.5 SHARP (NEEDLE) ×2
NEEDLE SPNL 20GX3.5 QUINCKE YW (NEEDLE) ×6 IMPLANT
NS IRRIG 500ML POUR BTL (IV SOLUTION) ×3 IMPLANT
PACK TOTAL KNEE (MISCELLANEOUS) ×3 IMPLANT
PAD WRAPON POLAR KNEE (MISCELLANEOUS) ×1 IMPLANT
PATELLA MEDIAL ATTUN 35MM KNEE (Knees) ×2 IMPLANT
PENCIL SMOKE ULTRAEVAC 22 CON (MISCELLANEOUS) ×3 IMPLANT
PIN DRILL QUICK PACK ×3 IMPLANT
PIN FIXATION 1/8DIA X 3INL (PIN) ×9 IMPLANT
PULSAVAC PLUS IRRIG FAN TIP (DISPOSABLE) ×3
SOL .9 NS 3000ML IRR  AL (IV SOLUTION) ×2
SOL .9 NS 3000ML IRR UROMATIC (IV SOLUTION) ×1 IMPLANT
SOL PREP PVP 2OZ (MISCELLANEOUS) ×3
SOLUTION PREP PVP 2OZ (MISCELLANEOUS) ×1 IMPLANT
SPONGE DRAIN TRACH 4X4 STRL 2S (GAUZE/BANDAGES/DRESSINGS) ×3 IMPLANT
STAPLER SKIN PROX 35W (STAPLE) ×3 IMPLANT
STOCKINETTE IMPERV 14X48 (MISCELLANEOUS) IMPLANT
STRAP TIBIA SHORT (MISCELLANEOUS) ×3 IMPLANT
SUCTION FRAZIER HANDLE 10FR (MISCELLANEOUS) ×2
SUCTION TUBE FRAZIER 10FR DISP (MISCELLANEOUS) ×1 IMPLANT
SUT VIC AB 0 CT1 36 (SUTURE) ×5 IMPLANT
SUT VIC AB 1 CT1 36 (SUTURE) ×6 IMPLANT
SUT VIC AB 2-0 CT2 27 (SUTURE) ×3 IMPLANT
SYR 20ML LL LF (SYRINGE) ×3 IMPLANT
SYR 30ML LL (SYRINGE) ×6 IMPLANT
TIP FAN IRRIG PULSAVAC PLUS (DISPOSABLE) ×1 IMPLANT
TOWEL OR 17X26 4PK STRL BLUE (TOWEL DISPOSABLE) ×3 IMPLANT
TOWER CARTRIDGE SMART MIX (DISPOSABLE) ×3 IMPLANT
TRAY FOLEY MTR SLVR 16FR STAT (SET/KITS/TRAYS/PACK) ×3 IMPLANT
WRAPON POLAR PAD KNEE (MISCELLANEOUS) ×3

## 2019-06-15 NOTE — Transfer of Care (Signed)
Immediate Anesthesia Transfer of Care Note  Patient: Monica Tyler  Procedure(s) Performed: COMPUTER ASSISTED TOTAL KNEE ARTHROPLASTY - RNFA (Left Knee)  Patient Location: PACU  Anesthesia Type:General  Level of Consciousness: drowsy and patient cooperative  Airway & Oxygen Therapy: Patient Spontanous Breathing and Patient connected to face mask oxygen  Post-op Assessment: Report given to RN and Post -op Vital signs reviewed and stable  Post vital signs: Reviewed and stable  Last Vitals:  Vitals Value Taken Time  BP 147/74 06/15/19 1542  Temp 37.6 C 06/15/19 1542  Pulse 97 06/15/19 1543  Resp 14 06/15/19 1543  SpO2 100 % 06/15/19 1543  Vitals shown include unvalidated device data.  Last Pain:  Vitals:   06/15/19 0954  TempSrc: Tympanic  PainSc: 6          Complications: No apparent anesthesia complications
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
## 2019-06-15 NOTE — H&P (Signed)
The patient has been re-examined, and the chart reviewed, and there have been no interval changes to the documented history and physical.    The risks, benefits, and alternatives have been discussed at length. The patient expressed understanding of the risks benefits and agreed with plans for surgical intervention.  James P. Hooten, Jr. M.D.    

## 2019-06-15 NOTE — Anesthesia Post-op Follow-up Note (Signed)
Anesthesia QCDR form completed.        

## 2019-06-15 NOTE — Anesthesia Procedure Notes (Signed)
Procedure Name: Intubation Date/Time: 06/15/2019 11:49 AM Performed by: Bernardo Heater, CRNA Pre-anesthesia Checklist: Patient identified, Patient being monitored, Timeout performed, Emergency Drugs available and Suction available Patient Re-evaluated:Patient Re-evaluated prior to induction Oxygen Delivery Method: Circle system utilized Preoxygenation: Pre-oxygenation with 100% oxygen Induction Type: IV induction Ventilation: Mask ventilation without difficulty Laryngoscope Size: Mac and 3 Grade View: Grade II Tube type: Oral Tube size: 7.0 mm Number of attempts: 1 Airway Equipment and Method: Stylet Placement Confirmation: ETT inserted through vocal cords under direct vision,  positive ETCO2 and breath sounds checked- equal and bilateral Secured at: 21 cm Tube secured with: Tape Dental Injury: Teeth and Oropharynx as per pre-operative assessment

## 2019-06-15 NOTE — Op Note (Signed)
OPERATIVE NOTE  DATE OF SURGERY:  06/15/2019  PATIENT NAME:  Monica Tyler   DOB: 11/27/1955  MRN: [id number]  PRE-OPERATIVE DIAGNOSIS: Degenerative arthrosis of the left knee, primary  POST-OPERATIVE DIAGNOSIS:  Same  PROCEDURE:  Left total knee arthroplasty using computer-assisted navigation  SURGEON:  Marciano Sequin. M.D.  ASSISTANT: Cassell Smiles, PA-C (present and scrubbed throughout the case, critical for assistance with exposure, retraction, instrumentation, and closure)  ANESTHESIA: general  ESTIMATED BLOOD LOSS: 50 mL  FLUIDS REPLACED: 1200 mL of crystalloid  TOURNIQUET TIME: 110 minutes  DRAINS: 2 medium Hemovac drains  SOFT TISSUE RELEASES: Anterior cruciate ligament, posterior cruciate ligament, deep medial collateral ligament, patellofemoral ligament  IMPLANTS UTILIZED: DePuy Attune size 5N posterior stabilized femoral component (cemented), size 4 rotating platform tibial component (cemented), 35 mm medialized dome patella (cemented), and a 5 mm stabilized rotating platform polyethylene insert.  INDICATIONS FOR SURGERY: Monica Tyler is a 63 y.o. year old female with a long history of progressive knee pain. X-rays demonstrated severe degenerative changes in tricompartmental fashion. The patient had not seen any significant improvement despite conservative nonsurgical intervention. After discussion of the risks and benefits of surgical intervention, the patient expressed understanding of the risks benefits and agree with plans for total knee arthroplasty.   The risks, benefits, and alternatives were discussed at length including but not limited to the risks of infection, bleeding, nerve injury, stiffness, blood clots, the need for revision surgery, cardiopulmonary complications, among others, and they were willing to proceed.  PROCEDURE IN DETAIL: The patient was brought into the operating room and, after adequate general anesthesia was achieved, a tourniquet was placed  on the patient's upper thigh. The patient's knee and leg were cleaned and prepped with alcohol and DuraPrep and draped in the usual sterile fashion. A "timeout" was performed as per usual protocol. The lower extremity was exsanguinated using an Esmarch, and the tourniquet was inflated to 300 mmHg. An anterior longitudinal incision was made followed by a standard mid vastus approach. The deep fibers of the medial collateral ligament were elevated in a subperiosteal fashion off of the medial flare of the tibia so as to maintain a continuous soft tissue sleeve. The patella was subluxed laterally and the patellofemoral ligament was incised. Inspection of the knee demonstrated severe degenerative changes with full-thickness loss of articular cartilage. Osteophytes were debrided using a rongeur. Anterior and posterior cruciate ligaments were excised. Two 4.0 mm Schanz pins were inserted in the femur and into the tibia for attachment of the array of trackers used for computer-assisted navigation. Hip center was identified using a circumduction technique. Distal landmarks were mapped using the computer. The distal femur and proximal tibia were mapped using the computer. The distal femoral cutting guide was positioned using computer-assisted navigation so as to achieve a 5 distal valgus cut. The femur was sized and it was felt that a size 5N femoral component was appropriate. A size 5 femoral cutting guide was positioned and the anterior cut was performed and verified using the computer. This was followed by completion of the posterior and chamfer cuts. Femoral cutting guide for the central box was then positioned in the center box cut was performed.  Attention was then directed to the proximal tibia. Medial and lateral menisci were excised. The extramedullary tibial cutting guide was positioned using computer-assisted navigation so as to achieve a 0 varus-valgus alignment and 3 posterior slope. The cut was performed  and verified using the computer. The proximal tibia was  sized and it was felt that a size 4 tibial tray was appropriate. Tibial and femoral trials were inserted followed by insertion of a 5 mm polyethylene insert.  The knee was felt to be tight both in flexion and in extension.  The tibial cutting guide was repositioned so as to resect an additional 2 mm of bone proximally.  The cut was formed verified using the computer.  Trials were reinserted with the 5 mm polyethylene trial.  This allowed for excellent mediolateral soft tissue balancing both in flexion and in full extension. Finally, the patella was cut and prepared so as to accommodate a 35 mm medialized dome patella. A patella trial was placed and the knee was placed through a range of motion with excellent patellar tracking appreciated. The femoral trial was removed after debridement of posterior osteophytes. The central post-hole for the tibial component was reamed followed by insertion of a keel punch. Tibial trials were then removed. Cut surfaces of bone were irrigated with copious amounts of normal saline with antibiotic solution using pulsatile lavage and then suctioned dry. Polymethylmethacrylate cement was prepared in the usual fashion using a vacuum mixer. Cement was applied to the cut surface of the proximal tibia as well as along the undersurface of a size 4 rotating platform tibial component. Tibial component was positioned and impacted into place. Excess cement was removed using [employer] fast streamer. Cement was then applied to the cut surfaces of the femur as well as along the posterior flanges of the size 5N femoral component. The femoral component was positioned and impacted into place. Excess cement was removed using [employer] fast streamer. A 5 mm polyethylene trial was inserted and the knee was brought into full extension with steady axial compression applied. Finally, cement was applied to the backside of a 35 mm medialized dome patella and the patellar  component was positioned and patellar clamp applied. Excess cement was removed using [employer] fast streamer. After adequate curing of the cement, the tourniquet was deflated after a total tourniquet time of 110 minutes. Hemostasis was achieved using electrocautery. The knee was irrigated with copious amounts of normal saline with antibiotic solution using pulsatile lavage and then suctioned dry. 20 mL of 1.3% Exparel and 60 mL of 0.25% Marcaine in 40 mL of normal saline was injected along the posterior capsule, medial and lateral gutters, and along the arthrotomy site. A 5 mm stabilized rotating platform polyethylene insert was inserted and the knee was placed through a range of motion with excellent mediolateral soft tissue balancing appreciated and excellent patellar tracking noted. 2 medium drains were placed in the wound bed and brought out through separate stab incisions. The medial parapatellar portion of the incision was reapproximated using interrupted sutures of #1 Vicryl. Subcutaneous tissue was approximated in layers using first #0 Vicryl followed #2-0 Vicryl. The skin was approximated with skin staples. A sterile dressing was applied.  The patient tolerated the procedure well and was transported to the recovery room in stable condition.    Brande Uncapher P. Holley Bouche., M.D.

## 2019-06-16 ENCOUNTER — Encounter: Payer: Self-pay | Admitting: Orthopedic Surgery

## 2019-06-16 LAB — ABO/RH: ABO/RH(D): A POS

## 2019-06-16 MED ORDER — OXYCODONE HCL 5 MG PO TABS: 5.0000 mg | ORAL_TABLET | ORAL | 0 refills | Status: DC | PRN

## 2019-06-16 MED ORDER — CHLORHEXIDINE GLUCONATE CLOTH 2 % EX PADS: 6.0000 | MEDICATED_PAD | Freq: Every day | CUTANEOUS | Status: DC

## 2019-06-16 MED ORDER — TRAMADOL HCL 50 MG PO TABS: 50.0000 mg | ORAL_TABLET | Freq: Four times a day (QID) | ORAL | 0 refills | Status: DC | PRN

## 2019-06-16 MED ORDER — SODIUM CHLORIDE 0.9% FLUSH: 10.0000 mL | INTRAVENOUS | Status: DC | PRN

## 2019-06-16 MED ORDER — ENOXAPARIN SODIUM 40 MG/0.4ML ~~LOC~~ SOLN: 40.0000 mg | SUBCUTANEOUS | 0 refills | Status: DC

## 2019-06-16 NOTE — TOC Progression Note (Signed)
Transition of Care [facility]) - Progression Note    Patient Details  Name: Monica Tyler MRN: [id number] Date of Birth: 05-Jan-1956  [facility] CM/SW Contact  Tateanna Bach, Lenice Llamas Phone Number: [phone]  06/16/2019, 8:59 AM  Clinical Narrative: Lovenox price requested.           Expected Discharge Plan and Services                                                 Social Determinants of Health (SDOH) Interventions    Readmission Risk Interventions No flowsheet data found.

## 2019-06-16 NOTE — Progress Notes (Signed)
Physical Therapy Treatment Patient Details Name: Monica Tyler MRN: [id number] DOB: Dec 14, 1955 Today's Date: 06/16/2019    History of Present Illness Pt is a 63 yo female s/p elective L TKA with PMHx for R TKA, HTN, cervical and lumbar spinal surgeries 2/2 DDD, arthritis, and asthma.    PT Comments    Pt presented with deficits in strength, transfers, mobility, gait, balance, L knee ROM, and activity tolerance.  Pt was Mod Ind with sup to sit this session with improved ability to move her LLE out of bed.  Pt was CGA with transfers with verbal cues for proper sequencing but was able to demonstrate good eccentric and concentric control.  Pt was able to amb 200' with a RW with gait pattern that progressed from step-to to step-through although with continued decreased LLE stance time compared to the right.  Pt was steady during amb without LOB or buckling and SpO2/HR were both WNL after walking.  Pt is progressing well while in acute care and will progress towards ascending and descending stairs during the next session.  Pt will benefit from HHPT services upon discharge to safely address above deficits for decreased caregiver assistance and eventual return to PLOF.     Follow Up Recommendations  Home health PT     Equipment Recommendations  Rolling walker with 5" wheels;3in1 (PT)    Recommendations for Other Services       Precautions / Restrictions Precautions Precautions: Fall;Knee Restrictions Weight Bearing Restrictions: Yes LLE Weight Bearing: Weight bearing as tolerated    Mobility  Bed Mobility Overal bed mobility: Modified Independent Bed Mobility: Supine to Sit     Supine to sit: Modified independent (Device/Increase time)     General bed mobility comments: Increased time and effort and use of bedrail but no physical assistance required  Transfers Overall transfer level: Needs assistance Equipment used: Rolling walker (2 wheeled) Transfers: Sit to/from Stand Sit to  Stand: Min guard         General transfer comment: Good eccentric and concentric control with transfers with min verbal cues for sequencing  Ambulation/Gait Ambulation/Gait assistance: Min guard Gait Distance (Feet): 200 Feet Assistive device: Rolling walker (2 wheeled) Gait Pattern/deviations: Step-to pattern;Step-through pattern;Decreased step length - right;Decreased step length - left;Antalgic Gait velocity: Decreased   General Gait Details: Step-to gait pattern slowly progressed to beginning step-through pattern but with decreased LLE stance time and corresponding RLE step length.  Pt steady throughout with no LOB or buckling.   Stairs             Wheelchair Mobility    Modified Rankin (Stroke Patients Only)       Balance Overall balance assessment: Needs assistance Sitting-balance support: Feet supported;Single extremity supported Sitting balance-Leahy Scale: Good     Standing balance support: Bilateral upper extremity supported Standing balance-Leahy Scale: Good                              Cognition Arousal/Alertness: Awake/alert Behavior During Therapy: WFL for tasks assessed/performed Overall Cognitive Status: Within Functional Limits for tasks assessed                                        Exercises Total Joint Exercises Ankle Circles/Pumps: AROM;Strengthening;Both;10 reps;15 reps Quad Sets: AROM;Strengthening;Left;10 reps;15 reps Gluteal Sets: Strengthening;Both;10 reps Heel Slides: AROM;Strengthening;Left;10 reps Hip ABduction/ADduction: AROM;Strengthening;Left;10 reps  Long Arc Quad: AROM;Strengthening;Left;10 reps;15 reps Knee Flexion: AROM;Strengthening;Left;10 reps;15 reps Marching in Standing: AROM;Strengthening;Both;10 reps Other Exercises Other Exercises: 90 deg L turn training x 4 to prevent CKC twisting on the L knee Other Exercises: Car transfer verbal education with pt and spouse for proper sequencing     General Comments        Pertinent Vitals/Pain Pain Assessment: 0-10 Pain Score: 4  Pain Location: L knee Pain Descriptors / Indicators: Aching;Sore Pain Intervention(s): Premedicated before session;Monitored during session    Home Living                      Prior Function            PT Goals (current goals can now be found in the care plan section) Progress towards PT goals: Progressing toward goals    Frequency    BID      PT Plan Current plan remains appropriate    Co-evaluation              AM-PAC PT "6 Clicks" Mobility   Outcome Measure  Help needed turning from your back to your side while in a flat bed without using bedrails?: A Little Help needed moving from lying on your back to sitting on the side of a flat bed without using bedrails?: A Little Help needed moving to and from a bed to a chair (including a wheelchair)?: A Little Help needed standing up from a chair using your arms (e.g., wheelchair or bedside chair)?: A Little Help needed to walk in hospital room?: A Little Help needed climbing 3-5 steps with a railing? : A Little 6 Click Score: 18    End of Session Equipment Utilized During Treatment: Gait belt Activity Tolerance: Patient tolerated treatment well Patient left: in chair;with call bell/phone within reach;with chair alarm set;with SCD's reapplied;Other (comment);with family/visitor present(Polar care donned to L knee) Nurse Communication: Mobility status PT Visit Diagnosis: Unsteadiness on feet (R26.81);Other abnormalities of gait and mobility (R26.89);Muscle weakness (generalized) (M62.81)     Time: [id number] PT Time Calculation (min) (ACUTE ONLY): 38 min  Charges:  $Gait Training: 8-22 mins $Therapeutic Exercise: 8-22 mins $Therapeutic Activity: 8-22 mins                     D. Scott Luisa Louk PT, DPT 06/16/19, 5:08 PM

## 2019-06-16 NOTE — Anesthesia Postprocedure Evaluation (Signed)
Anesthesia Post Note  Patient: Monica Tyler  Procedure(s) Performed: COMPUTER ASSISTED TOTAL KNEE ARTHROPLASTY - RNFA (Left Knee)  Patient location during evaluation: PACU Anesthesia Type: General Level of consciousness: awake and alert Pain management: pain level controlled Vital Signs Assessment: post-procedure vital signs reviewed and stable Respiratory status: spontaneous breathing, nonlabored ventilation and respiratory function stable Cardiovascular status: blood pressure returned to baseline and stable Postop Assessment: no apparent nausea or vomiting Anesthetic complications: no     Last Vitals:  Vitals:   06/15/19 2044 06/15/19 2252  BP: 137/70 128/66  Pulse: 98 87  Resp: 18 19  Temp: 37.2 C 37.1 C  SpO2: 96% 96%    Last Pain:  Vitals:   06/16/19 0141  TempSrc:   PainSc: [place]

## 2019-06-16 NOTE — Evaluation (Signed)
Physical Therapy Evaluation Patient Details Name: Monica Tyler MRN: [id number] DOB: Apr 20, 1956 Today's Date: 06/16/2019   History of Present Illness  63yo female s/p L TKA with PMHx for R TKA, HTN, cervical and lumbar spinal surgeries 2/2 DDD, arthritis, and asthma.  Clinical Impression  Pt presented with deficits in ROM, strength, transfers, mobility, gait, balance, and activity tolerance. Pt's pain was limiting but not extreme, and her SpO2 was within 94-97% throughout the session. Pt completed seated exercises as outlined in the TKA HEP and displayed good pickup of the material with cuing. Pt required min A to move feetinto position  while seated at EOB, and min A for balance while walking. Pt did not fatigue after 80' walk, but was generally tired and preferred to rest in the bed until the afternoon. Pt will benefit from HHPT upon discharge to safely address above deficits for decreased caregiver assistance and eventual return to PLOF.      Follow Up Recommendations Home health PT    Equipment Recommendations  Rolling walker with 5" wheels;3in1 (PT)    Recommendations for Other Services       Precautions / Restrictions Precautions Precautions: Fall;Knee Precaution Booklet Issued: Yes (comment) Precaution Comments: Pt education and knee HEP Restrictions Weight Bearing Restrictions: Yes LLE Weight Bearing: Weight bearing as tolerated      Mobility  Bed Mobility Overal bed mobility: Needs Assistance Bed Mobility: Sit to Supine     Supine to sit: Supervision Sit to supine: Min assist   General bed mobility comments: incr time/effort 2/2 pain with min A to clear heels into the bed  Transfers Overall transfer level: Needs assistance Equipment used: Rolling walker (2 wheeled) Transfers: Sit to/from Stand Sit to Stand: Min guard         General transfer comment: pt able to sit <> stand with min cuing and CGA  Ambulation/Gait Ambulation/Gait assistance: Min  guard Gait Distance (Feet): 80 Feet Assistive device: Rolling walker (2 wheeled) Gait Pattern/deviations: Step-through pattern;Decreased step length - right;Decreased step length - left;Antalgic;Trunk flexed Gait velocity: Decreased   General Gait Details: Pt leaned into walker for support  Stairs            Wheelchair Mobility    Modified Rankin (Stroke Patients Only)       Balance Overall balance assessment: Needs assistance Sitting-balance support: Feet supported;Single extremity supported Sitting balance-Leahy Scale: Fair     Standing balance support: Bilateral upper extremity supported Standing balance-Leahy Scale: Fair                               Pertinent Vitals/Pain Pain Assessment: 0-10 Pain Score: 6  Pain Location: L knee (increasing from 4/10 at rest to 6/10 with functional mobility and ambulation) Pain Descriptors / Indicators: Sharp;Cramping Pain Intervention(s): Limited activity within patient's tolerance;Monitored during session;Premedicated before session    Home Living Family/patient expects to be discharged to:: Private residence Living Arrangements: Spouse/significant other Available Help at Discharge: Family;Available 24 hours/day Type of Home: House Home Access: Stairs to enter Entrance Stairs-Rails: None Entrance Stairs-Number of Steps: 2 Home Layout: One level Home Equipment: Shower seat - built in      Prior Function Level of Independence: Independent         Comments: Pt indep with mobility, ADL, difficulty with housekeeping tasks 2/2 back and knee pain, drives, no falls     Hand Dominance   Dominant Hand: Left    Extremity/Trunk  Assessment   Upper Extremity Assessment Upper Extremity Assessment: Overall WFL for tasks assessed    Lower Extremity Assessment Lower Extremity Assessment: Generalized weakness;LLE deficits/detail(expected post-op strength/ROM deficits) LLE Deficits / Details: expected post-op  strength/ROM deficits LLE: Unable to fully assess due to pain    Cervical / Trunk Assessment Cervical / Trunk Assessment: Normal  Communication   Communication: No difficulties  Cognition Arousal/Alertness: Awake/alert Behavior During Therapy: WFL for tasks assessed/performed Overall Cognitive Status: Within Functional Limits for tasks assessed                                        General Comments      Exercises Total Joint Exercises Ankle Circles/Pumps: AROM;Strengthening;Both;10 reps;15 reps Quad Sets: AROM;Strengthening;Left;10 reps;15 reps Heel Slides: AROM;Strengthening;Left;5 reps;10 reps Hip ABduction/ADduction: AROM;Strengthening;Left;10 reps Straight Leg Raises: AROM;Strengthening;Left;5 reps;10 reps Long Arc Quad: AROM;Strengthening;Left;5 reps;10 reps Knee Flexion: AROM;Strengthening;Left;5 reps;10 reps Goniometric ROM: L knee 11-75 degrees Marching in Standing: AROM;Strengthening;Both;5 reps;10 reps Other Exercises Other Exercises: Pt education on TKA handout and HEP. Good carryover for muscle activation during functional mobility and gait.   Assessment/Plan    PT Assessment Patient needs continued PT services  PT Problem List Decreased strength;Decreased range of motion;Decreased activity tolerance;Decreased balance;Pain       PT Treatment Interventions Gait training;Stair training;Functional mobility training;Therapeutic activities;Therapeutic exercise;Balance training;Patient/family education    PT Goals (Current goals can be found in the Care Plan section)  Acute Rehab PT Goals Patient Stated Goal: Return to doing ADLs independently PT Goal Formulation: With patient Time For Goal Achievement: 06/29/19 Potential to Achieve Goals: Good    Frequency BID   Barriers to discharge        Co-evaluation               AM-PAC PT "6 Clicks" Mobility  Outcome Measure Help needed turning from your back to your side while in a flat bed  without using bedrails?: A Little Help needed moving from lying on your back to sitting on the side of a flat bed without using bedrails?: A Little Help needed moving to and from a bed to a chair (including a wheelchair)?: A Little Help needed standing up from a chair using your arms (e.g., wheelchair or bedside chair)?: A Little Help needed to walk in hospital room?: A Little Help needed climbing 3-5 steps with a railing? : A Lot 6 Click Score: 17    End of Session Equipment Utilized During Treatment: Gait belt Activity Tolerance: Patient limited by pain Patient left: in bed;with call bell/phone within reach;with bed alarm set;with SCD's reapplied;Other (comment)(L knee Polar Care donned) Nurse Communication: Mobility status PT Visit Diagnosis: Unsteadiness on feet (R26.81);Other abnormalities of gait and mobility (R26.89);Muscle weakness (generalized) (M62.81)    Time: [id number] PT Time Calculation (min) (ACUTE ONLY): 40 min   Charges:              Juanda Crumble "Gus" Jeannette Corpus, SPT  06/16/19, 1:46 PM

## 2019-06-16 NOTE — Discharge Summary (Signed)
Physician Discharge Summary  Patient ID: Monica Tyler MRN: [id number] DOB/AGE: 02-11-1956 63 y.o.  Admit date: 06/15/2019 Discharge date: 06/17/2019  Admission Diagnoses:  PRIMARY OSTEOARTHRITIS OF LEFT KNEE   Discharge Diagnoses: Patient Active Problem List   Diagnosis Date Noted  . Total knee replacement status 06/15/2019  . Primary osteoarthritis of left knee 05/10/2019  . Status post total right knee replacement 05/10/2019  . Sebaceous cyst of skin of breast, right 09/23/2017  . Cellulitis of chest wall 09/23/2017    Past Medical History:  Diagnosis Date  . Arthritis    PAIN AND SEVERE ARTHRITIS RIGHT KNEE;  ALSO ARTHRITIS  LEFT KNEE BUT PAIN NOT AS BAD  . Asthma    NO ASTHMA PROBLEMS SINCE TEEN YEARS  . Cancer ([place])    HX CERVICAL CANCER IN THE 80'S  . DDD (degenerative disc disease)    LUMBAR AND CERVICAL--S/P 2 LUMBAR SURGERIE--CHRONIC NECK AND BACK PAIN AND NUMBNESS DOWN RIGHT LEG AND  TOES ON RT FOOT AND HANDS SOMETIMES GO NUMB  . Fracture of capitate of left wrist   . Fracture, ankle   . History of kidney stones   . Hypertension   . UTI (lower urinary tract infection) 12/15/11   started antibiotic at home. Pt does not know name of drug.     Transfusion: No transfusions during this admission   Consultants (if any): None  Discharged Condition: Improved  Hospital Course: CIERRAH MORIEL is an 63 y.o. female who was admitted 06/15/2019 with a diagnosis of degenerative arthrosis left knee and went to the operating room on 06/15/2019 and underwent the above named procedures.    Surgeries:Procedure(s): COMPUTER ASSISTED TOTAL KNEE ARTHROPLASTY - RNFA on 06/15/2019  PRE-OPERATIVE DIAGNOSIS: Degenerative arthrosis of the left knee, primary  POST-OPERATIVE DIAGNOSIS:  Same  PROCEDURE:  Left total knee arthroplasty using computer-assisted navigation  SURGEON:  Marciano Sequin. M.D.  ASSISTANT: Cassell Smiles, PA-C (present and scrubbed throughout the case,  critical for assistance with exposure, retraction, instrumentation, and closure)  ANESTHESIA: general  ESTIMATED BLOOD LOSS: 50 mL  FLUIDS REPLACED: 1200 mL of crystalloid  TOURNIQUET TIME: 110 minutes  DRAINS: 2 medium Hemovac drains  SOFT TISSUE RELEASES: Anterior cruciate ligament, posterior cruciate ligament, deep medial collateral ligament, patellofemoral ligament  IMPLANTS UTILIZED: DePuy Attune size 5N posterior stabilized femoral component (cemented), size 4 rotating platform tibial component (cemented), 35 mm medialized dome patella (cemented), and a 5 mm stabilized rotating platform polyethylene insert.  INDICATIONS FOR SURGERY: Monica Tyler is a 63 y.o. year old female with a long history of progressive knee pain. X-rays demonstrated severe degenerative changes in tricompartmental fashion. The patient had not seen any significant improvement despite conservative nonsurgical intervention. After discussion of the risks and benefits of surgical intervention, the patient expressed understanding of the risks benefits and agree with plans for total knee arthroplasty.   The risks, benefits, and alternatives were discussed at length including but not limited to the risks of infection, bleeding, nerve injury, stiffness, blood clots, the need for revision surgery, cardiopulmonary complications, among others, and they were willing to proceed. Patient tolerated the surgery well. No complications .Patient was taken to PACU where she was stabilized and then transferred to the orthopedic floor.  Patient started on Lovenox 30 mg q 12 hrs. Foot pumps applied bilaterally at 80 mm hgb. Heels elevated off bed with rolled towels. No evidence of DVT. Calves non tender. Negative Homan. Physical therapy started on day #1 for gait training and  transfer with OT starting on  day #1 for ADL and assisted devices. Patient has done well with therapy. Ambulated 200 feet upon being discharged.  Was able  to ascend and descend 4 steps safely and independently  Patient's IV And Foley were discontinued on day #1 with Hemovac being discontinued on day #2. Dressing was changed on day 2 prior to patient being discharged   She was given perioperative antibiotics:  Anti-infectives (From admission, onward)   Start     Dose/Rate Route Frequency Ordered Stop   06/15/19 1800  clindamycin (CLEOCIN) IVPB 600 mg     600 mg 100 mL/hr over 30 Minutes Intravenous Every 6 hours 06/15/19 1709 06/16/19 2159   06/15/19 0958  clindamycin (CLEOCIN) 900 MG/50ML IVPB    Note to Pharmacy: Calton Tyler   : cabinet override      06/15/19 0958 06/15/19 1203   06/15/19 0600  clindamycin (CLEOCIN) IVPB 900 mg  Status:  Discontinued     900 mg 100 mL/hr over 30 Minutes Intravenous On call to O.R. 06/14/19 2141 06/15/19 1008    .  She was fitted with AV 1 compression foot pump devices, instructed on heel pumps, early ambulation, and fitted with TED stockings bilaterally for DVT prophylaxis.  She benefited maximally from the hospital stay and there were no complications.    Recent vital signs:  Vitals:   06/15/19 2252 06/16/19 0359  BP: 128/66 (!) 101/51  Pulse: 87 76  Resp: 19 18  Temp: 98.8 F (37.1 C) 97.8 F (36.6 C)  SpO2: 96% 96%    Recent laboratory studies:  Lab Results  Component Value Date   HGB 13.6 06/09/2019   HGB 9.8 (L) 12/24/2011   HGB 10.6 (L) 12/23/2011   Lab Results  Component Value Date   WBC 7.0 06/09/2019   PLT 260 06/09/2019   Lab Results  Component Value Date   INR 1.0 06/09/2019   Lab Results  Component Value Date   NA 141 06/09/2019   K 4.0 06/09/2019   CL 103 06/09/2019   CO2 28 06/09/2019   BUN 14 06/09/2019   CREATININE 0.56 06/09/2019   GLUCOSE 93 06/09/2019    Discharge Medications:   Allergies as of 06/16/2019      Reactions   Penicillins Swelling, Rash   Did it involve swelling of the face/tongue/throat, SOB, or low BP? Unknown Did it involve  sudden or severe rash/hives, skin peeling, or any reaction on the inside of your mouth or nose? Unknown Did you need to seek medical attention at a hospital or doctor's office? No When did it last happen?childhood reaction If all above answers are "NO", may proceed with cephalosporin use.   Codeine Nausea And Vomiting      Medication List    TAKE these medications   amLODipine 10 MG tablet Commonly known as: NORVASC Take 10 mg by mouth daily.   BLACK COHOSH PO Take 540 mg by mouth at bedtime.   Calcium 500+D3 500-400 MG-UNIT Tabs Generic drug: Calcium Carb-Cholecalciferol Take 1 tablet by mouth daily.   diclofenac sodium 1 % Gel Commonly known as: VOLTAREN Apply 1 application topically 4 (four) times daily as needed for pain.   docusate sodium 100 MG capsule Commonly known as: COLACE Take 100 mg by mouth 2 (two) times daily.   enoxaparin 40 MG/0.4ML injection Commonly known as: LOVENOX Inject 0.4 mLs (40 mg total) into the skin daily for 14 days. Start taking on: June 18, 2019  gabapentin 300 MG capsule Commonly known as: NEURONTIN Take 300 mg by mouth 2 (two) times daily.   HYDROcodone-acetaminophen 10-325 MG tablet Commonly known as: NORCO Take 1 tablet by mouth 3 (three) times daily.   meloxicam 15 MG tablet Commonly known as: MOBIC Take 15 mg by mouth every evening.   multivitamin with minerals Tabs tablet Take 1 tablet by mouth daily. Centrum Silver   oxyCODONE 5 MG immediate release tablet Commonly known as: Oxy IR/ROXICODONE Take 1 tablet (5 mg total) by mouth every 4 (four) hours as needed for moderate pain (pain score 4-6).   sulfamethoxazole-trimethoprim 400-80 MG tablet Commonly known as: BACTRIM Take 1 tablet by mouth 2 (two) times daily.   tiZANidine 4 MG tablet Commonly known as: ZANAFLEX Take 4 mg by mouth 2 (two) times daily.   traMADol 50 MG tablet Commonly known as: ULTRAM Take 1-2 tablets (50-100 mg total) by mouth every 6  (six) hours as needed for moderate pain.            Durable Medical Equipment  (From admission, onward)         Start     Ordered   06/15/19 1710  DME Walker rolling  Once    Question:  Patient needs a walker to treat with the following condition  Answer:  Total knee replacement status   06/15/19 1709   06/15/19 1710  DME Bedside commode  Once    Question:  Patient needs a bedside commode to treat with the following condition  Answer:  Total knee replacement status   06/15/19 1709          Diagnostic Studies: Dg Knee Left Port  Result Date: 06/15/2019 CLINICAL DATA:  Total knee replacement EXAM: PORTABLE LEFT KNEE - 1-2 VIEW COMPARISON:  12/02/2013 FINDINGS: Left knee replacement in satisfactory position and alignment. No fracture or complication. Soft tissue drain in the suprapatellar bursa. IMPRESSION: Satisfactory left knee replacement. Electronically Signed   By: Franchot Gallo M.D.   On: 06/15/2019 16:28    Disposition:   Discharge Instructions    Increase activity slowly   Complete by: As directed       Follow-up Information    Watt Climes, PA On 06/29/2019.   Specialty: Physician Assistant Why: at 1:15pm Contact information: [place] [address] [phone]        Dereck Leep, MD On 07/28/2019.   Specialty: Orthopedic Surgery Why: at 2:00pm Contact information: [place] [address] [phone]            Signed: Watt Climes 06/16/2019, 7:12 AM

## 2019-06-16 NOTE — TOC Initial Note (Signed)
Transition of Care [facility]) - Initial/Assessment Note    Patient Details  Name: Monica Tyler MRN: [id number] Date of Birth: August 22, 1956  [facility] of [facility]) CM/SW Contact:    Sherina Stammer, Lenice Llamas Phone Number: [phone]  06/16/2019, 12:00 PM  Clinical Narrative: PT is recommending home health. Clinical Social Worker (CSW) met with patient to discuss D/C plan. Patient was alert and oriented X4 and was sitting up in the bed eating lunch. CSW introduced self and explained role of CSW department. Per patient she lives in [place] with her husband Herbie [place]. CSW explained home health process and that surgeon's office prefers [facility]. Patient is agreeable to [facility]. CSW provided patient with CMS home health list. Per Helene Kelp [facility] home [facility] representative they can accept patient. Patient requested a rolling walker and bedside commode. CSW sent an email to [employer] DME agency representative making him aware of above. CSW made patient aware that her Lovenox price is $90. Patient reported that she can afford the Lovenox. Per [place] assistant while she was checking the price for patient's Lovenox she was notifyed by Truddie Coco with [employer] that patient's oxycodone was rejected because the pharmacy needs a DUR code. Surgeon and RN aware of above. CSW will continue to follow and assist as needed.                  Expected Discharge Plan: [place] Barriers to Discharge: Continued Medical Work up   Patient Goals and CMS Choice Patient states their goals for this hospitalization and ongoing recovery are:: To go home. CMS Medicare.gov Compare Post Acute Care list provided to:: Patient Choice offered to / list presented to : Patient  Expected Discharge Plan and Services Expected Discharge Plan: [place] In-house Referral: Clinical Social Work   Post Acute Care Choice: [place] arrangements for the past 2 months: [place]                 DME Arranged: Bedside commode, Walker rolling DME Agency: [employer] Date DME Agency Contacted: 06/16/19 Time DME Agency Contacted: [id number] Representative spoke with at DME Agency: [place]: PT Progress: [facility] (formerly [employer]) Date [place]: 06/16/19 Time [place]: 58 Representative spoke with at [place] Arrangements/Services Living arrangements for the past 2 months: [place] Lives with:: Spouse Patient language and need for interpreter reviewed:: No Do you feel safe going back to the place where you live?: Yes      Need for Family Participation in Patient Care: No (Comment) Care giver support system in place?: Yes (comment)   Criminal Activity/Legal Involvement Pertinent to Current Situation/Hospitalization: No - Comment as needed  Activities of Daily Living Home Assistive Devices/Equipment: Eyeglasses ADL Screening (condition at time of admission) Patient's cognitive ability adequate to safely complete daily activities?: Yes Is the patient deaf or have difficulty hearing?: No Does the patient have difficulty seeing, even when wearing glasses/contacts?: No Does the patient have difficulty concentrating, remembering, or making decisions?: No Patient able to express need for assistance with ADLs?: Yes Does the patient have difficulty dressing or bathing?: No Independently performs ADLs?: Yes (appropriate for developmental age) Does the patient have difficulty walking or climbing stairs?: Yes Weakness of Legs: Left Weakness of Arms/Hands: Right  Permission Sought/Granted Permission sought to share information with : Other (comment)(Home Health agency and DME agency) Permission granted  to share information with : Yes, Verbal Permission Granted              Emotional Assessment Appearance:: Appears stated age Attitude/Demeanor/Rapport: Engaged Affect (typically  observed): Calm, Pleasant Orientation: : Oriented to Self, Oriented to Place, Oriented to  Time, Oriented to Situation Alcohol / Substance Use: Not Applicable Psych Involvement: No (comment)  Admission diagnosis:  PRIMARY OSTEOARTHRITIS OF LEFT KNEE Patient Active Problem List   Diagnosis Date Noted  . Total knee replacement status 06/15/2019  . Primary osteoarthritis of left knee 05/10/2019  . Status post total right knee replacement 05/10/2019  . Sebaceous cyst of skin of breast, right 09/23/2017  . Cellulitis of chest wall 09/23/2017   PCP:  Lavone Orn, MD Pharmacy:   [id number] [address], [place] [address] Phone: [phone] Fax: [phone]     Social Determinants of Health (SDOH) Interventions    Readmission Risk Interventions No flowsheet data found.

## 2019-06-16 NOTE — Evaluation (Signed)
Occupational Therapy Evaluation Patient Details Name: Monica Tyler MRN: [id number] DOB: [birth year] Today's Date: 06/16/2019    History of Present Illness 63yo female s/p L TKA with PMHx for R TKA, HTN, cervical and lumbar spinal surgeries 2/2 DDD.   Clinical Impression   Pt seen for OT evaluation this date, POD#1 from above surgery. Pt was independent in all ADLs prior to surgery, however having increasing difficulty with housekeeping tasks such as vaccuming, laundry and washing dishes at the sink due to L knee pain. Pt is eager to return to PLOF with less pain and improved safety and independence. Pt currently requires minimal assist for LB dressing while in seated position due to pain and limited AROM of L knee. Supervision for bed mobility with pt reporting mild nausea upon initially sitting EOB but resolved in a couple minutes. RN notified. Pt instructed in polar care mgt, falls prevention strategies including pet care considerations, home/routines modifications, DME/AE for LB bathing and dressing tasks, and compression stocking mgt. Handout provided. Pt would benefit from skilled OT services including additional instruction in dressing techniques with or without assistive devices for dressing and bathing skills to support recall and carryover prior to discharge and ultimately to maximize safety, independence, and minimize falls risk and caregiver burden. Do not currently anticipate any OT needs following this hospitalization.      Follow Up Recommendations  No OT follow up    Equipment Recommendations  3 in 1 bedside commode;Other (comment)(reacher)    Recommendations for Other Services       Precautions / Restrictions Precautions Precautions: Fall;Knee Restrictions Weight Bearing Restrictions: Yes LLE Weight Bearing: Weight bearing as tolerated      Mobility Bed Mobility Overal bed mobility: Needs Assistance Bed Mobility: Supine to Sit     Supine to sit: Supervision      General bed mobility comments: incr time/effort 2/2 pain but no physical assist needed  Transfers Overall transfer level: Needs assistance Equipment used: Rolling walker (2 wheeled) Transfers: Sit to/from Stand Sit to Stand: Min guard         General transfer comment: after initial instruction in technique, pt able to return demo requiring CGA    Balance Overall balance assessment: Needs assistance Sitting-balance support: Feet supported;Single extremity supported Sitting balance-Leahy Scale: Fair     Standing balance support: Bilateral upper extremity supported Standing balance-Leahy Scale: Fair                             ADL either performed or assessed with clinical judgement   ADL                                         General ADL Comments: Min A for LB dressing, CGA for toilet t/f to [facility], Mod A for compression stockings     Vision Baseline Vision/History: Wears glasses Wears Glasses: At all times Patient Visual Report: No change from baseline       Perception     Praxis      Pertinent Vitals/Pain Pain Assessment: 0-10 Pain Score: 9  Pain Location: L knee (increasing from 6/10 at rest to 9/10 with initial functional mobility) Pain Descriptors / Indicators: Aching Pain Intervention(s): Limited activity within patient's tolerance;Monitored during session;Repositioned;Ice applied;Patient requesting pain meds-RN notified;RN gave pain meds during session     Hand Dominance Left  Extremity/Trunk Assessment Upper Extremity Assessment Upper Extremity Assessment: Overall WFL for tasks assessed   Lower Extremity Assessment Lower Extremity Assessment: LLE deficits/detail;Defer to PT evaluation(expected post-op strenght/ROM deficits) LLE: Unable to fully assess due to pain   Cervical / Trunk Assessment Cervical / Trunk Assessment: Normal   Communication Communication Communication: No difficulties   Cognition  Arousal/Alertness: Awake/alert Behavior During Therapy: WFL for tasks assessed/performed Overall Cognitive Status: Within Functional Limits for tasks assessed                                     General Comments       Exercises Other Exercises Other Exercises: Pt instructed in polar care mgt, compression stocking mgt, falls prevention, pet care considerations, AE/DME, and home/routines modifications to maximize safety/indep; handout provided to support recall and carryover   Shoulder Instructions      Home Living Family/patient expects to be discharged to:: Private residence Living Arrangements: Spouse/significant other Available Help at Discharge: Family;Available 24 hours/day Type of Home: House Home Access: Stairs to enter [employer] of Steps: 2 Entrance Stairs-Rails: None Home Layout: One level     Bathroom Shower/Tub: Occupational psychologist: Handicapped height     Home Equipment: Shower seat - built in          Prior Functioning/Environment Level of Independence: Independent        Comments: Pt indep with mobility, ADL, difficulty with housekeeping tasks 2/2 back and knee pain, drives, no falls        OT Problem List: Decreased strength;Decreased range of motion;Pain;Impaired balance (sitting and/or standing);Decreased knowledge of use of DME or AE      OT Treatment/Interventions: Self-care/ADL training;Therapeutic activities;Therapeutic exercise;DME and/or AE instruction;Patient/family education;Balance training    OT Goals(Current goals can be found in the care plan section) Acute Rehab OT Goals Patient Stated Goal: go home OT Goal Formulation: With patient Time For Goal Achievement: 06/30/19 Potential to Achieve Goals: Good ADL Goals Pt Will Perform Lower Body Dressing: with supervision;sit to/from stand;with adaptive equipment Pt Will Transfer to Toilet: with supervision;ambulating(BSC over toilet, LRAD For  amb) Additional ADL Goal #1: Pt will independently instruct family/caregiver in compression stocking mgt Additional ADL Goal #2: Pt will independently instruct family/caregiver in polar care mgt  OT Frequency: Min 1X/week   Barriers to D/C:            Co-evaluation              AM-PAC OT "6 Clicks" Daily Activity     Outcome Measure Help from another person eating meals?: None Help from another person taking care of personal grooming?: None Help from another person toileting, which includes using toliet, bedpan, or urinal?: A Little Help from another person bathing (including washing, rinsing, drying)?: A Little Help from another person to put on and taking off regular upper body clothing?: None Help from another person to put on and taking off regular lower body clothing?: A Little 6 Click Score: 21   End of Session Equipment Utilized During Treatment: Gait belt;Rolling walker  Activity Tolerance: Patient tolerated treatment well Patient left: in chair;with call bell/phone within reach;with chair alarm set;with SCD's reapplied;Other (comment);with nursing/sitter in room(polar care in place)  OT Visit Diagnosis: Other abnormalities of gait and mobility (R26.89);Pain Pain - Right/Left: Left Pain - part of body: Knee  [id number] OT Time Calculation (min): 35 min Charges:  OT General Charges $OT Visit: 1 Visit OT Evaluation $OT Eval Low Complexity: 1 Low OT Treatments $Self Care/Home Management : 23-37 mins  Jeni Salles, MPH, MS, OTR/L ascom [phone] 06/16/19, 9:53 AM

## 2019-06-16 NOTE — Progress Notes (Signed)
   Subjective: 1 Day Post-Op Procedure(s) (LRB): COMPUTER ASSISTED TOTAL KNEE ARTHROPLASTY - RNFA (Left) Patient reports pain as 3 on 0-10 scale.   Patient is well, and has had no acute complaints or problems We will start therapy today.  Plan is to go Home after hospital stay. no nausea and no vomiting Patient denies any chest pains or shortness of breath. Objective: Vital signs in last 24 hours: Temp:  [97.5 F (36.4 C)-100 F (37.8 C)] 97.8 F (36.6 C) (09/22 0359) Pulse Rate:  [68-99] 76 (09/22 0359) Resp:  [11-35] 18 (09/22 0359) BP: (101-147)/(51-81) 101/51 (09/22 0359) SpO2:  [96 %-100 %] 96 % (09/22 0359) Weight:  [98.9 kg] 98.9 kg (09/21 0954) Heels are non tender and elevated off the bed using rolled towels along with bone foam under operative leg  Intake/Output from previous day: 09/21 0701 - 09/22 0700 In: 2895.2 [P.O.:100; I.V.:2395.1; IV Piggyback:400] Out: 2295 [Urine:2125; Drains:120; Blood:50] Intake/Output this shift: No intake/output data recorded.  No results for input(s): HGB in the last 72 hours. No results for input(s): WBC, RBC, HCT, PLT in the last 72 hours. No results for input(s): NA, K, CL, CO2, BUN, CREATININE, GLUCOSE, CALCIUM in the last 72 hours. No results for input(s): LABPT, INR in the last 72 hours.  EXAM General - Patient is Alert, Appropriate and Oriented Extremity - Neurologically intact Neurovascular intact Sensation intact distally Intact pulses distally Dorsiflexion/Plantar flexion intact Compartment soft Dressing - dressing C/D/I Motor Function - intact, moving foot and toes well on exam.  Unable to do straight leg raise on her own  Past Medical History:  Diagnosis Date  . Arthritis    PAIN AND SEVERE ARTHRITIS RIGHT KNEE;  ALSO ARTHRITIS  LEFT KNEE BUT PAIN NOT AS BAD  . Asthma    NO ASTHMA PROBLEMS SINCE TEEN YEARS  . Cancer ([place])    HX CERVICAL CANCER IN THE 80'S  . DDD (degenerative disc disease)    LUMBAR AND  CERVICAL--S/P 2 LUMBAR SURGERIE--CHRONIC NECK AND BACK PAIN AND NUMBNESS DOWN RIGHT LEG AND  TOES ON RT FOOT AND HANDS SOMETIMES GO NUMB  . Fracture of capitate of left wrist   . Fracture, ankle   . History of kidney stones   . Hypertension   . UTI (lower urinary tract infection) 12/15/11   started antibiotic at home. Pt does not know name of drug.    Assessment/Plan: 1 Day Post-Op Procedure(s) (LRB): COMPUTER ASSISTED TOTAL KNEE ARTHROPLASTY - RNFA (Left) Active Problems:   Total knee replacement status  Estimated body mass index is 36.28 kg/m as calculated from the following:   Height as of this encounter: 5\' 5"  (1.651 m).   Weight as of this encounter: 98.9 kg. Advance diet Up with therapy D/C IV fluids Plan for discharge tomorrow Discharge home with home health  Labs: None DVT Prophylaxis - Lovenox, Foot Pumps and TED hose Weight-Bearing as tolerated to left leg D/C O2 and Pulse OX and try on Room Air  Begin working on bowel movement  Jon R. Webster [place] 06/16/2019, 7:06 AM

## 2019-06-16 NOTE — TOC Benefit Eligibility Note (Signed)
Transition of Care [facility]) Benefit Eligibility Note    Patient Details  Name: STACY ATAYDE MRN: [id number] Date of Birth: [birth year]   Medication/Dose: Enoxaparin 40 mg daily x 14 days  Covered?: Yes(Generic - Enoxaparin)  Tier: Other(Tier 4)  Prescription Coverage Preferred Pharmacy: [place] with Person/Company/Phone Number:: Dan Humphreys [place], [phone]  Co-Pay: $90 - per Audelia Hives shows pd. claim  Prior Approval: No     Additional Notes: Lovenox not on formulary    [place] Phone Number: 06/16/2019, 10:21 AM

## 2019-06-17 MED ORDER — SODIUM CHLORIDE 0.9 % IV BOLUS
500.0000 mL | Freq: Once | INTRAVENOUS | Status: AC
  Administered 2019-06-17: 500 mL via INTRAVENOUS

## 2019-06-17 NOTE — Progress Notes (Signed)
Physical Therapy Treatment Patient Details Name: Monica Tyler MRN: [id number] DOB: [birth year] Today's Date: 06/17/2019    History of Present Illness Pt is a 63 yo female s/p elective L TKA with PMHx for R TKA, HTN, cervical and lumbar spinal surgeries 2/2 DDD, arthritis, and asthma.    PT Comments    Pt presented with deficits in strength, transfers, mobility, gait, balance, activity tolerance, and L knee ROM. Pt reported feeling "groggy" due to the combination of little overnight sleep and her medications, SpO2 & HR were monitored and were WNL throughout. Pt completed knee flexion and extension exercises in the recliner and showed improvement in flexion. Upon standing pt experienced "burning" pain from the back of her neck to her forehead, and was immediately returned to sitting and pain subsided. BP was taken and found to be 86/93 whereas it had been 146/75 earlier in the morning. Nursing was notified and they came in to do their own assessment. When deemed medically appropriate for discharge, pt will benefit from HHPT services to safely address above deficits for decreased caregiver assistance and eventual return to PLOF.   Follow Up Recommendations  Home health PT     Equipment Recommendations  Rolling walker with 5" wheels;3in1 (PT)    Recommendations for Other Services       Precautions / Restrictions Precautions Precautions: Fall;Knee Precaution Booklet Issued: Yes (comment)(TKA handout) Precaution Comments: Pt education and knee HEP repetition Restrictions Weight Bearing Restrictions: Yes LLE Weight Bearing: Weight bearing as tolerated    Mobility  Bed Mobility               General bed mobility comments: NT, pt was seated in recliner  Transfers Overall transfer level: Needs assistance Equipment used: Rolling walker (2 wheeled) Transfers: Sit to/from Stand Sit to Stand: Min guard         General transfer comment: Good eccentric and concentric control with  transfers with min verbal cues for sequencing  Ambulation/Gait                 Stairs             Wheelchair Mobility    Modified Rankin (Stroke Patients Only)       Balance Overall balance assessment: Needs assistance Sitting-balance support: Feet supported;Single extremity supported Sitting balance-Leahy Scale: Fair Sitting balance - Comments: Pt was tired and sitting slumped back into the recliner   Standing balance support: Bilateral upper extremity supported Standing balance-Leahy Scale: Fair Standing balance comment: Pt briefly leaned heavily on the RW before experiencing burning discomfort on the back of her neck and forehead and sat back down                            Cognition Arousal/Alertness: Lethargic;Suspect due to medications(Pt reported that she didn't sleep well last night and that medication can make her "groggy") Behavior During Therapy: WFL for tasks assessed/performed(Pt tried to focus but was groggy) Overall Cognitive Status: Within Functional Limits for tasks assessed                                        Exercises Total Joint Exercises Ankle Circles/Pumps: AROM;Strengthening;Both;10 reps;15 reps Quad Sets: AROM;Strengthening;Left;10 reps;15 reps Heel Slides: AROM;Right;10 reps;AAROM;Left;15 reps Straight Leg Raises: AROM;Strengthening;Left;5 reps Knee Flexion: AROM;Strengthening;Both;10 reps;15 reps Goniometric ROM: L knee AROM 13-83 degrees Other Exercises Other  Exercises: Pt completed roughly half of her TKA HEP with min A and verbal cuing    General Comments        Pertinent Vitals/Pain Pain Score: 4  Pain Location: L knee. Upon transfering to standing described a burning pain that started at the back of her neck and then progressed to the front of her head, feel subsided upon return to sitting. Pain Descriptors / Indicators: Aching;Sore;Burning Pain Intervention(s): Monitored during  session;Limited activity within patient's tolerance    Home Living                      Prior Function            PT Goals (current goals can now be found in the care plan section) Progress towards PT goals: Not progressing toward goals - comment(Limited session 2/2 to medical issues)    Frequency    BID      PT Plan Current plan remains appropriate    Co-evaluation              AM-PAC PT "6 Clicks" Mobility   Outcome Measure  Help needed turning from your back to your side while in a flat bed without using bedrails?: A Little Help needed moving from lying on your back to sitting on the side of a flat bed without using bedrails?: A Little Help needed moving to and from a bed to a chair (including a wheelchair)?: A Little Help needed standing up from a chair using your arms (e.g., wheelchair or bedside chair)?: A Little Help needed to walk in hospital room?: Total Help needed climbing 3-5 steps with a railing? : Total 6 Click Score: 14    End of Session Equipment Utilized During Treatment: Gait belt Activity Tolerance: Patient limited by pain;Patient limited by lethargy;Treatment limited secondary to medical complications (Comment) Patient left: in chair;with call bell/phone within reach;with chair alarm set;with SCD's reapplied;Other (comment);with nursing/sitter in room(Nursing replaced pt's Polar Care cuff which was leaking, donned on L knee) Nurse Communication: Other (comment)(Low BP upon standing) PT Visit Diagnosis: Unsteadiness on feet (R26.81);Other abnormalities of gait and mobility (R26.89);Muscle weakness (generalized) (M62.81)     Time: [id number] PT Time Calculation (min) (ACUTE ONLY): 34 min  Charges:                        Juanda Crumble "Gus" Jeannette Corpus, SPT  06/17/19, 10:54 AM

## 2019-06-17 NOTE — Progress Notes (Signed)
Physical Therapy Treatment Patient Details Name: RADIA FALSO MRN: [id number] DOB: 02-16-1956 Today's Date: 06/17/2019    History of Present Illness Pt is a 63 yo female s/p elective L TKA with PMHx for R TKA, HTN, cervical and lumbar spinal surgeries 2/2 DDD, arthritis, and asthma.    PT Comments    Pt presented with deficits in strength, transfers, mobility, gait, balance, activity tolerance, and L knee ROM. Pt had improved since episode in the morning, and was willing to attempt stair training. Pt's HR & SpO2 were monitored throughout and were WNL, and BP was taken after doing warm up/HEP and it was 142/65 while sitting. Pt completed sit <> stand with min cuing and CGA for stability. Pt walked to the nurses station on the way to the stairs in the gym and improved her gait from a halting step-to pattern to a slow and steady step-through pattern with cuing. Pt demonstrated good sequencing, control, and stability with backward ascending and forward descending 2 stairs with RW and no rails training, and completed it with close CGA and verbal cuing, and spouse practiced guarding the RW. Pt will benefit from HHPT services upon discharge to safely address above deficits for decreased caregiver assistance and eventual return to PLOF.   Follow Up Recommendations  Home health PT     Equipment Recommendations  Rolling walker with 5" wheels;3in1 (PT)    Recommendations for Other Services       Precautions / Restrictions Precautions Precautions: Fall;Knee Precaution Booklet Issued: Yes (comment) Precaution Comments: Pt education and knee HEP repetition Restrictions Weight Bearing Restrictions: Yes LLE Weight Bearing: Weight bearing as tolerated    Mobility  Bed Mobility               General bed mobility comments: NT, pt was seated in recliner  Transfers Overall transfer level: Needs assistance Equipment used: Rolling walker (2 wheeled) Transfers: Sit to/from Stand Sit to  Stand: Min guard         General transfer comment: Good eccentric and concentric control with transfers with min verbal cues for sequencing  Ambulation/Gait Ambulation/Gait assistance: Min guard Gait Distance (Feet): 120 Feet Assistive device: Rolling walker (2 wheeled) Gait Pattern/deviations: Step-through pattern;Decreased step length - right;Decreased step length - left;Antalgic Gait velocity: Decreased   General Gait Details: Started with step-to gait pattern and slowly progressed to step-through pattern but with decreased LLE stance time and corresponding RLE step length. Pt steady throughout with no LOB or buckling.   Stairs Stairs: Yes Stairs assistance: Min guard Stair Management: No rails Number of Stairs: 2 General stair comments: Pt completed stair training with RW + training spouse on how to provide safety support   Wheelchair Mobility    Modified Rankin (Stroke Patients Only)       Balance Overall balance assessment: Needs assistance Sitting-balance support: Single extremity supported Sitting balance-Leahy Scale: Good Sitting balance - Comments: Pt was able to sit in recliner on edge of seat as necessary   Standing balance support: Single extremity supported Standing balance-Leahy Scale: Fair Standing balance comment: Pt tended to lean heavily on the RW but with cuing was able to stand up with improving [place]                            Cognition Arousal/Alertness: Awake/alert Behavior During Therapy: WFL for tasks assessed/performed Overall Cognitive Status: Within Functional Limits for tasks assessed  Exercises Total Joint Exercises Ankle Circles/Pumps: AROM;Strengthening;Both;10 reps;15 reps Quad Sets: AROM;Strengthening;Left;10 reps;15 reps Hip ABduction/ADduction: AROM;Strengthening;Both;10 reps;15 reps Straight Leg Raises: AROM;Strengthening;Left;5 reps Long Arc Quad:  AROM;Strengthening;Left;10 reps;15 reps Knee Flexion: AROM;Strengthening;Both;10 reps;15 reps Marching in Standing: AROM;Strengthening;Both;10 reps;15 reps Other Exercises Other Exercises: Stair training and practicing "up with the good, down with the bad" pattern and RW placement + spouse education for proper sequencing and support    General Comments        Pertinent Vitals/Pain Pain Assessment: 0-10 Pain Score: 4  Pain Location: L knee Pain Descriptors / Indicators: Aching;Shooting;Sore Pain Intervention(s): Limited activity within patient's tolerance;Monitored during session    Home Living                      Prior Function            PT Goals (current goals can now be found in the care plan section) Progress towards PT goals: Progressing toward goals    Frequency    BID      PT Plan Current plan remains appropriate    Co-evaluation              AM-PAC PT "6 Clicks" Mobility   Outcome Measure  Help needed turning from your back to your side while in a flat bed without using bedrails?: A Little Help needed moving from lying on your back to sitting on the side of a flat bed without using bedrails?: A Little Help needed moving to and from a bed to a chair (including a wheelchair)?: A Little Help needed standing up from a chair using your arms (e.g., wheelchair or bedside chair)?: A Little Help needed to walk in hospital room?: A Little Help needed climbing 3-5 steps with a railing? : A Little 6 Click Score: 18    End of Session Equipment Utilized During Treatment: Gait belt Activity Tolerance: Patient tolerated treatment well Patient left: in chair;with call bell/phone within reach;with chair alarm set;with SCD's reapplied;Other (comment);with family/visitor present(L knee Polar Care donned) Nurse Communication: Mobility status PT Visit Diagnosis: Unsteadiness on feet (R26.81);Other abnormalities of gait and mobility (R26.89);Muscle weakness  (generalized) (M62.81)     Time: [id number] PT Time Calculation (min) (ACUTE ONLY): 38 min  Charges:                       Juanda Crumble "Gus" Jeannette Corpus, SPT  06/17/19, 5:14 PM

## 2019-06-17 NOTE — Progress Notes (Signed)
   Subjective: 2 Days Post-Op Procedure(s) (LRB): COMPUTER ASSISTED TOTAL KNEE ARTHROPLASTY - RNFA (Left) Patient reports pain as 7 on 0-10 scale.   Patient is well, and has had no acute complaints or problems Patient did very well with therapy yesterday.  Was able to ambulate around the nurses desk.  Range of motion 0 75. Plan is to go Home after hospital stay. no nausea and no vomiting Patient denies any chest pains or shortness of breath. Having increased pain this morning.  Otherwise no change in condition voicing no complaints  Objective: Vital signs in last 24 hours: Temp:  [97.4 F (36.3 C)-98 F (36.7 C)] 98 F (36.7 C) (09/22 2321) Pulse Rate:  [63-82] 82 (09/22 2321) Resp:  [20] 20 (09/22 2321) BP: (111-142)/(58-73) 142/73 (09/22 2321) SpO2:  [95 %-100 %] 97 % (09/22 2321) well approximated incision Heels are non tender and elevated off the bed using rolled towels Intake/Output from previous day: 09/22 0701 - 09/23 0700 In: 767.1 [P.O.:480; I.V.:237.1; IV Piggyback:50] Out: 190 [Drains:190] Intake/Output this shift: Total I/O In: 0  Out: 50 [Drains:50]  No results for input(s): HGB in the last 72 hours. No results for input(s): WBC, RBC, HCT, PLT in the last 72 hours. No results for input(s): NA, K, CL, CO2, BUN, CREATININE, GLUCOSE, CALCIUM in the last 72 hours. No results for input(s): LABPT, INR in the last 72 hours.  EXAM General - Patient is Alert, Appropriate and Oriented Extremity - Neurologically intact Neurovascular intact Sensation intact distally Intact pulses distally Dorsiflexion/Plantar flexion intact No cellulitis present Compartment soft Dressing - scant drainage Motor Function - intact, moving foot and toes well on exam.  Past Medical History:  Diagnosis Date  . Arthritis    PAIN AND SEVERE ARTHRITIS RIGHT KNEE;  ALSO ARTHRITIS  LEFT KNEE BUT PAIN NOT AS BAD  . Asthma    NO ASTHMA PROBLEMS SINCE TEEN YEARS  . Cancer ([place])    HX  CERVICAL CANCER IN THE 80'S  . DDD (degenerative disc disease)    LUMBAR AND CERVICAL--S/P 2 LUMBAR SURGERIE--CHRONIC NECK AND BACK PAIN AND NUMBNESS DOWN RIGHT LEG AND  TOES ON RT FOOT AND HANDS SOMETIMES GO NUMB  . Fracture of capitate of left wrist   . Fracture, ankle   . History of kidney stones   . Hypertension   . UTI (lower urinary tract infection) 12/15/11   started antibiotic at home. Pt does not know name of drug.    Assessment/Plan: 2 Days Post-Op Procedure(s) (LRB): COMPUTER ASSISTED TOTAL KNEE ARTHROPLASTY - RNFA (Left) Active Problems:   Total knee replacement status  Estimated body mass index is 36.28 kg/m as calculated from the following:   Height as of this encounter: 5\' 5"  (1.651 m).   Weight as of this encounter: 98.9 kg. Up with therapy Discharge home with home health  Labs: None DVT Prophylaxis - Lovenox, Foot Pumps and TED hose Weight-Bearing as tolerated to left leg Patient needs to do stairs prior to being discharged Please wash operative leg, change dressing and apply TED stockings to both legs prior to discharge Please give the patient 2 extra honeycomb dressings to take home  [place]. [place] 06/17/2019, 6:21 AM

## 2019-06-17 NOTE — Progress Notes (Signed)
Pt started Bolus 500cc to Right Upper Arm without difficulty . Transferred to [facility] , rechecked BP it was 112/63 in right arm

## 2019-06-17 NOTE — Progress Notes (Signed)
Pt with Physical Therapy and went to stand up , states it felt like burning sensation in the back of her neck and BP dropped to 86/63. Paged PA oncall , pt given PO fluids encouraged and awaiting call back , alert and able to make needs known Pulse 81

## 2019-06-17 NOTE — TOC Transition Note (Signed)
Transition of Care [facility]) - CM/SW Discharge Note   Patient Details  Name: JALAH GOTTO MRN: [id number] Date of Birth: 1956-01-01  [facility]) CM/SW Contact:  Kasim Mccorkle, Lenice Llamas Phone Number: [phone]  06/17/2019, 9:29 AM   Clinical Narrative: Clinical Social Worker (CSW) notified Helene Kelp [facility] representative that patient will D/C home today. [facility] [employer] D[employer] representative delivered rolling walker and bedside commode to patient's room. Patient has been notified of her Lovenox price $90. Per surgeon oxycodone has been canceled at the pharmacy and patient will continue with Norco as previously prescribed. Please reconsult if future social work needs arise. CSW signing off.   Final next level of care: [place] Barriers to Discharge: Barriers Resolved   Patient Goals and CMS Choice Patient states their goals for this hospitalization and ongoing recovery are:: To go home. CMS Medicare.gov Compare Post Acute Care list provided to:: Patient Choice offered to / list presented to : Patient  Discharge Placement                       Discharge Plan and Services In-house Referral: Clinical Social Work   Post Acute Care Choice: Home Health          DME Arranged: Bedside commode, Walker rolling DME Agency: [employer] Date DME Agency Contacted: 06/16/19 Time DME Agency Contacted: [id number] Representative spoke with at DME Agency: [place]: PT [place]: [facility] (formerly [employer]) Date [place]: 06/17/19 Time [place]: [phone] Representative spoke with at [place]: [place] ([place]) Interventions     Readmission Risk Interventions No flowsheet data found.

## 2019-06-17 NOTE — Progress Notes (Signed)
Pt discharged home with instructions and follow up appts, transferred via facility w/c to personal vehicle with husband , no incident.

## 2019-06-17 NOTE — Progress Notes (Signed)
Pt in no acute distress. VSS. Tolerated steps with PT this afternoon. MD made aware. Per MD okay to discharge now.

## 2019-06-20 DIAGNOSIS — Z87440 Personal history of urinary (tract) infections: Secondary | ICD-10-CM | POA: Diagnosis not present

## 2019-06-20 DIAGNOSIS — Z96653 Presence of artificial knee joint, bilateral: Secondary | ICD-10-CM | POA: Diagnosis not present

## 2019-06-20 DIAGNOSIS — M5136 Other intervertebral disc degeneration, lumbar region: Secondary | ICD-10-CM | POA: Diagnosis not present

## 2019-06-20 DIAGNOSIS — M5030 Other cervical disc degeneration, unspecified cervical region: Secondary | ICD-10-CM | POA: Diagnosis not present

## 2019-06-20 DIAGNOSIS — Z9181 History of falling: Secondary | ICD-10-CM | POA: Diagnosis not present

## 2019-06-20 DIAGNOSIS — Z471 Aftercare following joint replacement surgery: Secondary | ICD-10-CM | POA: Diagnosis not present

## 2019-06-20 DIAGNOSIS — J45909 Unspecified asthma, uncomplicated: Secondary | ICD-10-CM | POA: Diagnosis not present

## 2019-06-20 DIAGNOSIS — Z8781 Personal history of (healed) traumatic fracture: Secondary | ICD-10-CM | POA: Diagnosis not present

## 2019-06-20 DIAGNOSIS — I10 Essential (primary) hypertension: Secondary | ICD-10-CM | POA: Diagnosis not present

## 2019-06-25 DIAGNOSIS — I10 Essential (primary) hypertension: Secondary | ICD-10-CM | POA: Diagnosis not present

## 2019-06-25 DIAGNOSIS — M5136 Other intervertebral disc degeneration, lumbar region: Secondary | ICD-10-CM | POA: Diagnosis not present

## 2019-06-25 DIAGNOSIS — J45909 Unspecified asthma, uncomplicated: Secondary | ICD-10-CM | POA: Diagnosis not present

## 2019-06-25 DIAGNOSIS — Z96653 Presence of artificial knee joint, bilateral: Secondary | ICD-10-CM | POA: Diagnosis not present

## 2019-06-25 DIAGNOSIS — Z8781 Personal history of (healed) traumatic fracture: Secondary | ICD-10-CM | POA: Diagnosis not present

## 2019-06-25 DIAGNOSIS — Z471 Aftercare following joint replacement surgery: Secondary | ICD-10-CM | POA: Diagnosis not present

## 2019-06-25 DIAGNOSIS — Z87440 Personal history of urinary (tract) infections: Secondary | ICD-10-CM | POA: Diagnosis not present

## 2019-06-25 DIAGNOSIS — M5030 Other cervical disc degeneration, unspecified cervical region: Secondary | ICD-10-CM | POA: Diagnosis not present

## 2019-06-25 DIAGNOSIS — Z9181 History of falling: Secondary | ICD-10-CM | POA: Diagnosis not present

## 2019-06-29 DIAGNOSIS — M25662 Stiffness of left knee, not elsewhere classified: Secondary | ICD-10-CM | POA: Diagnosis not present

## 2019-06-29 DIAGNOSIS — R29898 Other symptoms and signs involving the musculoskeletal system: Secondary | ICD-10-CM | POA: Diagnosis not present

## 2019-06-29 DIAGNOSIS — Z96652 Presence of left artificial knee joint: Secondary | ICD-10-CM | POA: Diagnosis not present

## 2019-06-29 DIAGNOSIS — M25562 Pain in left knee: Secondary | ICD-10-CM | POA: Diagnosis not present

## 2019-07-01 DIAGNOSIS — M25562 Pain in left knee: Secondary | ICD-10-CM | POA: Diagnosis not present

## 2019-07-01 DIAGNOSIS — Z96652 Presence of left artificial knee joint: Secondary | ICD-10-CM | POA: Diagnosis not present

## 2019-07-06 DIAGNOSIS — Z96652 Presence of left artificial knee joint: Secondary | ICD-10-CM | POA: Diagnosis not present

## 2019-07-09 DIAGNOSIS — M25562 Pain in left knee: Secondary | ICD-10-CM | POA: Diagnosis not present

## 2019-07-09 DIAGNOSIS — Z96652 Presence of left artificial knee joint: Secondary | ICD-10-CM | POA: Diagnosis not present

## 2019-07-13 DIAGNOSIS — M25562 Pain in left knee: Secondary | ICD-10-CM | POA: Diagnosis not present

## 2019-07-13 DIAGNOSIS — Z96652 Presence of left artificial knee joint: Secondary | ICD-10-CM | POA: Diagnosis not present

## 2019-07-16 DIAGNOSIS — M25562 Pain in left knee: Secondary | ICD-10-CM | POA: Diagnosis not present

## 2019-07-16 DIAGNOSIS — Z96652 Presence of left artificial knee joint: Secondary | ICD-10-CM | POA: Diagnosis not present

## 2019-07-19 DIAGNOSIS — Z471 Aftercare following joint replacement surgery: Secondary | ICD-10-CM | POA: Diagnosis not present

## 2019-07-20 ENCOUNTER — Other Ambulatory Visit: Payer: Self-pay | Admitting: Obstetrics & Gynecology

## 2019-07-20 DIAGNOSIS — M25562 Pain in left knee: Secondary | ICD-10-CM | POA: Diagnosis not present

## 2019-07-20 DIAGNOSIS — Z96652 Presence of left artificial knee joint: Secondary | ICD-10-CM | POA: Diagnosis not present

## 2019-07-20 DIAGNOSIS — Z1211 Encounter for screening for malignant neoplasm of colon: Secondary | ICD-10-CM

## 2019-07-23 DIAGNOSIS — M25562 Pain in left knee: Secondary | ICD-10-CM | POA: Diagnosis not present

## 2019-07-23 DIAGNOSIS — Z96652 Presence of left artificial knee joint: Secondary | ICD-10-CM | POA: Diagnosis not present

## 2019-07-27 DIAGNOSIS — Z96652 Presence of left artificial knee joint: Secondary | ICD-10-CM | POA: Diagnosis not present

## 2019-07-27 DIAGNOSIS — M25562 Pain in left knee: Secondary | ICD-10-CM | POA: Diagnosis not present

## 2019-07-28 DIAGNOSIS — M1712 Unilateral primary osteoarthritis, left knee: Secondary | ICD-10-CM | POA: Diagnosis not present

## 2019-07-28 DIAGNOSIS — Z96652 Presence of left artificial knee joint: Secondary | ICD-10-CM | POA: Diagnosis not present

## 2019-07-31 DIAGNOSIS — M25562 Pain in left knee: Secondary | ICD-10-CM | POA: Diagnosis not present

## 2019-07-31 DIAGNOSIS — Z96652 Presence of left artificial knee joint: Secondary | ICD-10-CM | POA: Diagnosis not present

## 2019-08-03 DIAGNOSIS — M4716 Other spondylosis with myelopathy, lumbar region: Secondary | ICD-10-CM | POA: Diagnosis not present

## 2019-08-03 DIAGNOSIS — Z96652 Presence of left artificial knee joint: Secondary | ICD-10-CM | POA: Diagnosis not present

## 2019-08-03 DIAGNOSIS — M791 Myalgia, unspecified site: Secondary | ICD-10-CM | POA: Diagnosis not present

## 2019-08-03 DIAGNOSIS — G894 Chronic pain syndrome: Secondary | ICD-10-CM | POA: Diagnosis not present

## 2019-08-10 DIAGNOSIS — Z96652 Presence of left artificial knee joint: Secondary | ICD-10-CM | POA: Diagnosis not present

## 2019-08-10 DIAGNOSIS — M25562 Pain in left knee: Secondary | ICD-10-CM | POA: Diagnosis not present

## 2019-08-14 DIAGNOSIS — M25562 Pain in left knee: Secondary | ICD-10-CM | POA: Diagnosis not present

## 2019-08-14 DIAGNOSIS — Z96652 Presence of left artificial knee joint: Secondary | ICD-10-CM | POA: Diagnosis not present

## 2019-08-17 DIAGNOSIS — M25562 Pain in left knee: Secondary | ICD-10-CM | POA: Diagnosis not present

## 2019-08-17 DIAGNOSIS — Z96652 Presence of left artificial knee joint: Secondary | ICD-10-CM | POA: Diagnosis not present

## 2019-09-01 DIAGNOSIS — G894 Chronic pain syndrome: Secondary | ICD-10-CM | POA: Diagnosis not present

## 2019-09-01 DIAGNOSIS — M4716 Other spondylosis with myelopathy, lumbar region: Secondary | ICD-10-CM | POA: Diagnosis not present

## 2019-09-01 DIAGNOSIS — M791 Myalgia, unspecified site: Secondary | ICD-10-CM | POA: Diagnosis not present

## 2019-09-17 DIAGNOSIS — E782 Mixed hyperlipidemia: Secondary | ICD-10-CM | POA: Diagnosis not present

## 2019-09-17 DIAGNOSIS — R7301 Impaired fasting glucose: Secondary | ICD-10-CM | POA: Diagnosis not present

## 2019-09-17 DIAGNOSIS — I10 Essential (primary) hypertension: Secondary | ICD-10-CM | POA: Diagnosis not present

## 2019-09-25 HISTORY — PX: COLONOSCOPY: SHX174

## 2019-10-01 DIAGNOSIS — M791 Myalgia, unspecified site: Secondary | ICD-10-CM | POA: Diagnosis not present

## 2019-10-01 DIAGNOSIS — M4716 Other spondylosis with myelopathy, lumbar region: Secondary | ICD-10-CM | POA: Diagnosis not present

## 2019-10-01 DIAGNOSIS — G894 Chronic pain syndrome: Secondary | ICD-10-CM | POA: Diagnosis not present

## 2019-10-29 DIAGNOSIS — G894 Chronic pain syndrome: Secondary | ICD-10-CM | POA: Diagnosis not present

## 2019-10-29 DIAGNOSIS — M791 Myalgia, unspecified site: Secondary | ICD-10-CM | POA: Diagnosis not present

## 2019-10-29 DIAGNOSIS — M4716 Other spondylosis with myelopathy, lumbar region: Secondary | ICD-10-CM | POA: Diagnosis not present

## 2019-11-26 DIAGNOSIS — M791 Myalgia, unspecified site: Secondary | ICD-10-CM | POA: Diagnosis not present

## 2019-11-26 DIAGNOSIS — G894 Chronic pain syndrome: Secondary | ICD-10-CM | POA: Diagnosis not present

## 2019-11-26 DIAGNOSIS — M4716 Other spondylosis with myelopathy, lumbar region: Secondary | ICD-10-CM | POA: Diagnosis not present

## 2019-12-10 DIAGNOSIS — Z96653 Presence of artificial knee joint, bilateral: Secondary | ICD-10-CM | POA: Diagnosis not present

## 2019-12-10 DIAGNOSIS — M170 Bilateral primary osteoarthritis of knee: Secondary | ICD-10-CM | POA: Diagnosis not present

## 2019-12-29 DIAGNOSIS — G894 Chronic pain syndrome: Secondary | ICD-10-CM | POA: Diagnosis not present

## 2019-12-29 DIAGNOSIS — M791 Myalgia, unspecified site: Secondary | ICD-10-CM | POA: Diagnosis not present

## 2019-12-29 DIAGNOSIS — M4716 Other spondylosis with myelopathy, lumbar region: Secondary | ICD-10-CM | POA: Diagnosis not present

## 2020-01-28 DIAGNOSIS — M4716 Other spondylosis with myelopathy, lumbar region: Secondary | ICD-10-CM | POA: Diagnosis not present

## 2020-01-28 DIAGNOSIS — M791 Myalgia, unspecified site: Secondary | ICD-10-CM | POA: Diagnosis not present

## 2020-01-28 DIAGNOSIS — G894 Chronic pain syndrome: Secondary | ICD-10-CM | POA: Diagnosis not present

## 2020-02-01 DIAGNOSIS — G894 Chronic pain syndrome: Secondary | ICD-10-CM | POA: Diagnosis not present

## 2020-02-01 DIAGNOSIS — Z79899 Other long term (current) drug therapy: Secondary | ICD-10-CM | POA: Diagnosis not present

## 2020-02-01 DIAGNOSIS — Z79891 Long term (current) use of opiate analgesic: Secondary | ICD-10-CM | POA: Diagnosis not present

## 2020-02-25 DIAGNOSIS — G894 Chronic pain syndrome: Secondary | ICD-10-CM | POA: Diagnosis not present

## 2020-02-25 DIAGNOSIS — M791 Myalgia, unspecified site: Secondary | ICD-10-CM | POA: Diagnosis not present

## 2020-02-25 DIAGNOSIS — M4716 Other spondylosis with myelopathy, lumbar region: Secondary | ICD-10-CM | POA: Diagnosis not present

## 2020-03-16 DIAGNOSIS — Z23 Encounter for immunization: Secondary | ICD-10-CM | POA: Diagnosis not present

## 2020-03-16 DIAGNOSIS — I10 Essential (primary) hypertension: Secondary | ICD-10-CM | POA: Diagnosis not present

## 2020-03-16 DIAGNOSIS — E782 Mixed hyperlipidemia: Secondary | ICD-10-CM | POA: Diagnosis not present

## 2020-03-16 DIAGNOSIS — R400 Somnolence: Secondary | ICD-10-CM | POA: Diagnosis not present

## 2020-03-16 DIAGNOSIS — Z0000 Encounter for general adult medical examination without abnormal findings: Secondary | ICD-10-CM | POA: Diagnosis not present

## 2020-03-16 DIAGNOSIS — K219 Gastro-esophageal reflux disease without esophagitis: Secondary | ICD-10-CM | POA: Diagnosis not present

## 2020-03-16 DIAGNOSIS — R7301 Impaired fasting glucose: Secondary | ICD-10-CM | POA: Diagnosis not present

## 2020-03-16 DIAGNOSIS — Z1389 Encounter for screening for other disorder: Secondary | ICD-10-CM | POA: Diagnosis not present

## 2020-03-29 DIAGNOSIS — G894 Chronic pain syndrome: Secondary | ICD-10-CM | POA: Diagnosis not present

## 2020-03-29 DIAGNOSIS — M4716 Other spondylosis with myelopathy, lumbar region: Secondary | ICD-10-CM | POA: Diagnosis not present

## 2020-03-29 DIAGNOSIS — M791 Myalgia, unspecified site: Secondary | ICD-10-CM | POA: Diagnosis not present

## 2020-04-07 ENCOUNTER — Encounter: Payer: Self-pay | Admitting: Obstetrics & Gynecology

## 2020-04-07 ENCOUNTER — Other Ambulatory Visit (HOSPITAL_COMMUNITY)
Admission: RE | Admit: 2020-04-07 | Discharge: 2020-04-07 | Disposition: A | Discharge status: Home / Self Care | Payer: PPO | Source: Ambulatory Visit | Attending: Obstetrics & Gynecology | Admitting: Obstetrics & Gynecology

## 2020-04-07 ENCOUNTER — Ambulatory Visit (INDEPENDENT_AMBULATORY_CARE_PROVIDER_SITE_OTHER): Payer: PPO | Admitting: Obstetrics & Gynecology

## 2020-04-07 ENCOUNTER — Other Ambulatory Visit: Payer: Self-pay

## 2020-04-07 VITALS — BP 130/80 | Ht 65.0 in | Wt 234.0 lb

## 2020-04-07 DIAGNOSIS — Z1151 Encounter for screening for human papillomavirus (HPV): Secondary | ICD-10-CM | POA: Insufficient documentation

## 2020-04-07 DIAGNOSIS — Z124 Encounter for screening for malignant neoplasm of cervix: Secondary | ICD-10-CM | POA: Diagnosis not present

## 2020-04-07 DIAGNOSIS — Z01419 Encounter for gynecological examination (general) (routine) without abnormal findings: Secondary | ICD-10-CM | POA: Diagnosis not present

## 2020-04-07 DIAGNOSIS — R61 Generalized hyperhidrosis: Secondary | ICD-10-CM | POA: Diagnosis not present

## 2020-04-07 DIAGNOSIS — Z1231 Encounter for screening mammogram for malignant neoplasm of breast: Secondary | ICD-10-CM

## 2020-04-07 DIAGNOSIS — Z1211 Encounter for screening for malignant neoplasm of colon: Secondary | ICD-10-CM

## 2020-04-07 MED ORDER — CLONIDINE HCL 0.2 MG PO TABS: 0.2000 mg | ORAL_TABLET | Freq: Two times a day (BID) | ORAL | 3 refills | Status: DC
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
## 2020-04-07 NOTE — Patient Instructions (Signed)
PAP every three years Mammogram every year    Call [phone] to schedule at [facility] Colonoscopy every 10 years Labs yearly (with PCP)  Thank you for choosing [facility]. As part of our ongoing efforts to improve patient experience, we would appreciate your feedback. Please fill out the short survey that you will receive by mail or MyChart. Your opinion is important to [place]!

## 2020-04-11 LAB — CYTOLOGY - PAP
Comment: NEGATIVE
Diagnosis: NEGATIVE
High risk HPV: NEGATIVE

## 2020-04-15 DIAGNOSIS — H2511 Age-related nuclear cataract, right eye: Secondary | ICD-10-CM | POA: Diagnosis not present

## 2020-04-26 DIAGNOSIS — M791 Myalgia, unspecified site: Secondary | ICD-10-CM | POA: Diagnosis not present

## 2020-04-26 DIAGNOSIS — M4716 Other spondylosis with myelopathy, lumbar region: Secondary | ICD-10-CM | POA: Diagnosis not present

## 2020-04-26 DIAGNOSIS — G894 Chronic pain syndrome: Secondary | ICD-10-CM | POA: Diagnosis not present

## 2020-04-29 DIAGNOSIS — K219 Gastro-esophageal reflux disease without esophagitis: Secondary | ICD-10-CM | POA: Diagnosis not present

## 2020-04-29 DIAGNOSIS — R400 Somnolence: Secondary | ICD-10-CM | POA: Diagnosis not present

## 2020-05-12 ENCOUNTER — Other Ambulatory Visit: Payer: Self-pay

## 2020-05-12 ENCOUNTER — Ambulatory Visit
Admission: RE | Admit: 2020-05-12 | Discharge: 2020-05-12 | Disposition: A | Discharge status: Home / Self Care | Payer: PPO | Source: Ambulatory Visit | Attending: Obstetrics & Gynecology | Admitting: Obstetrics & Gynecology

## 2020-05-12 DIAGNOSIS — Z1231 Encounter for screening mammogram for malignant neoplasm of breast: Secondary | ICD-10-CM | POA: Diagnosis not present

## 2020-05-15 ENCOUNTER — Encounter: Payer: Self-pay | Admitting: Obstetrics & Gynecology

## 2020-05-26 DIAGNOSIS — M791 Myalgia, unspecified site: Secondary | ICD-10-CM | POA: Diagnosis not present

## 2020-05-26 DIAGNOSIS — M4716 Other spondylosis with myelopathy, lumbar region: Secondary | ICD-10-CM | POA: Diagnosis not present

## 2020-05-26 DIAGNOSIS — G894 Chronic pain syndrome: Secondary | ICD-10-CM | POA: Diagnosis not present

## 2020-06-07 DIAGNOSIS — Z96653 Presence of artificial knee joint, bilateral: Secondary | ICD-10-CM | POA: Diagnosis not present

## 2020-06-23 DIAGNOSIS — G894 Chronic pain syndrome: Secondary | ICD-10-CM | POA: Diagnosis not present

## 2020-06-23 DIAGNOSIS — M7062 Trochanteric bursitis, left hip: Secondary | ICD-10-CM | POA: Diagnosis not present

## 2020-07-02 IMAGING — MG DIGITAL DIAGNOSTIC UNILATERAL RIGHT MAMMOGRAM WITH TOMO AND CAD
4 series · 4 of 12 positions shown · non-contrast
Comparison: Previous exam(s).

CLINICAL DATA: 63-year-old female for further evaluation of
possible RIGHT breast asymmetry on screening mammogram.

EXAM:
DIGITAL DIAGNOSTIC UNILATERAL RIGHT MAMMOGRAM WITH CAD AND TOMO

[R CC synth-2D]
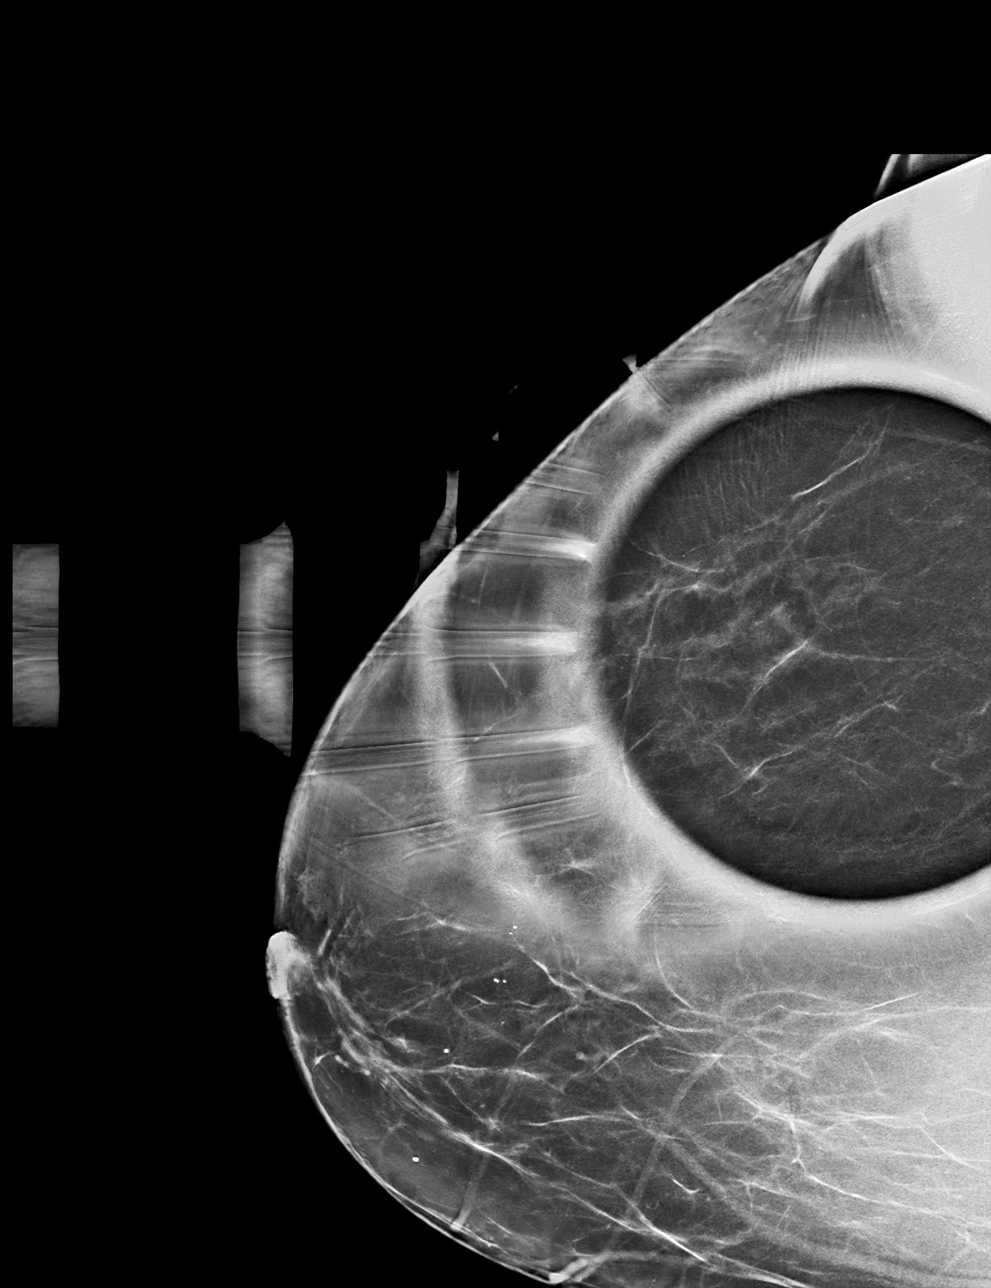

[R ML synth-2D]
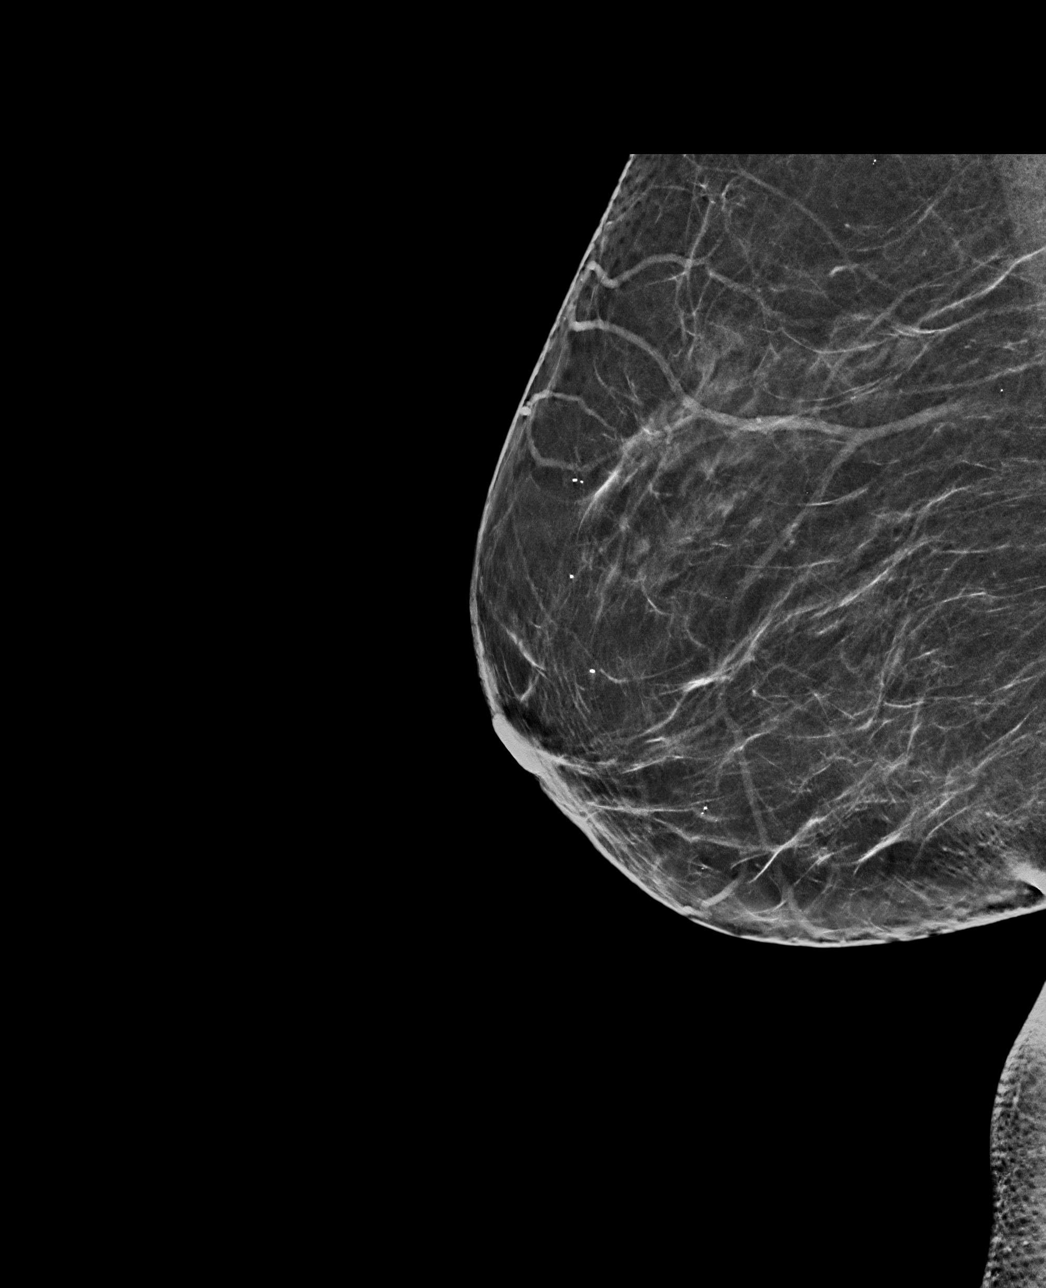

[R ML tomo · tomo slice 32/63.0]
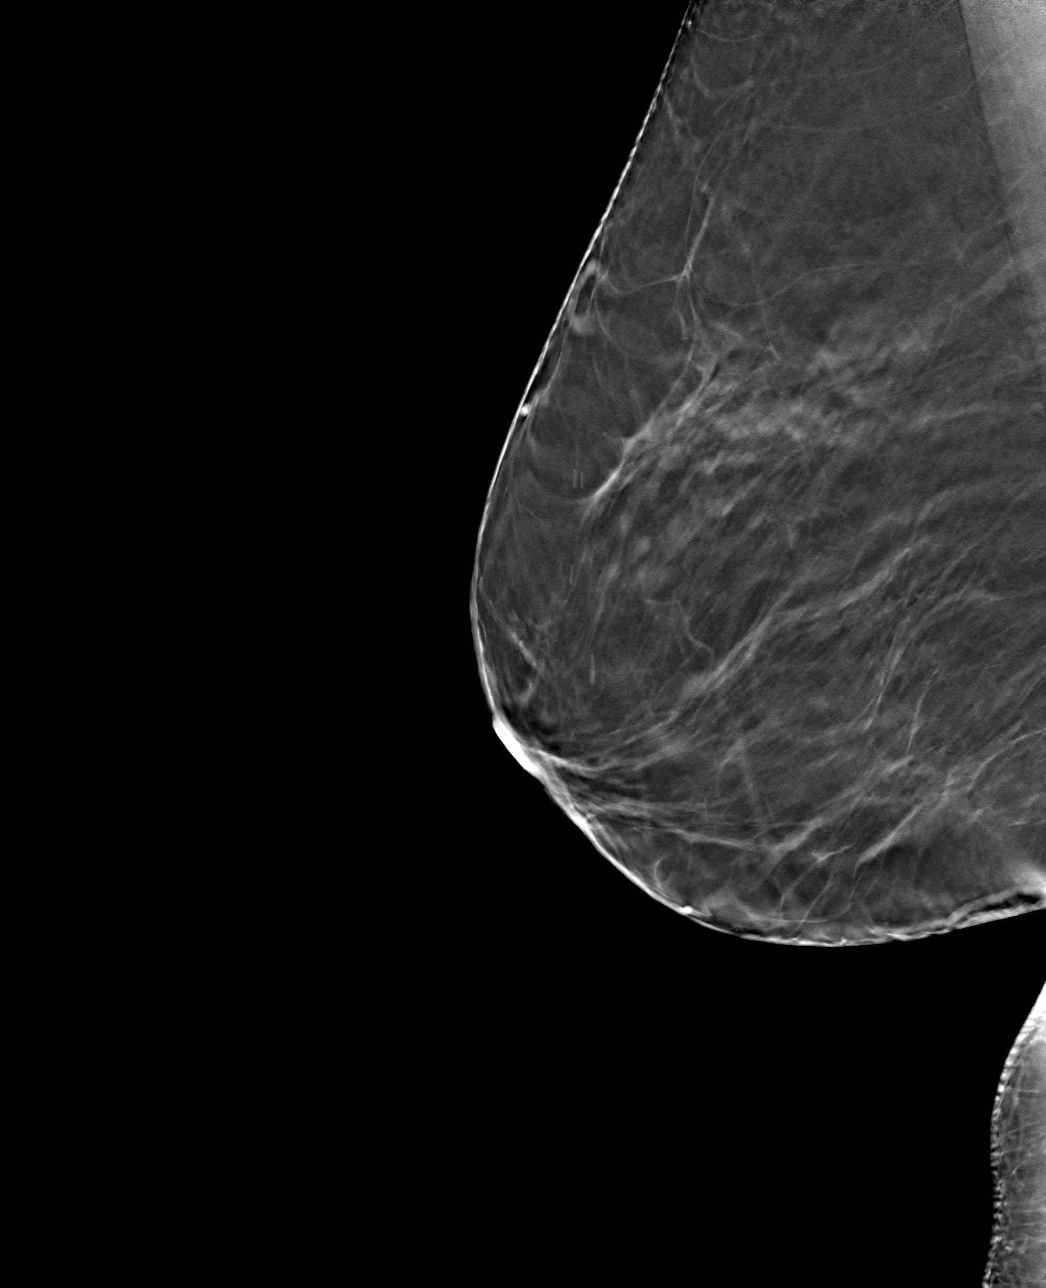

[R CC tomo · tomo slice 33/66.0]
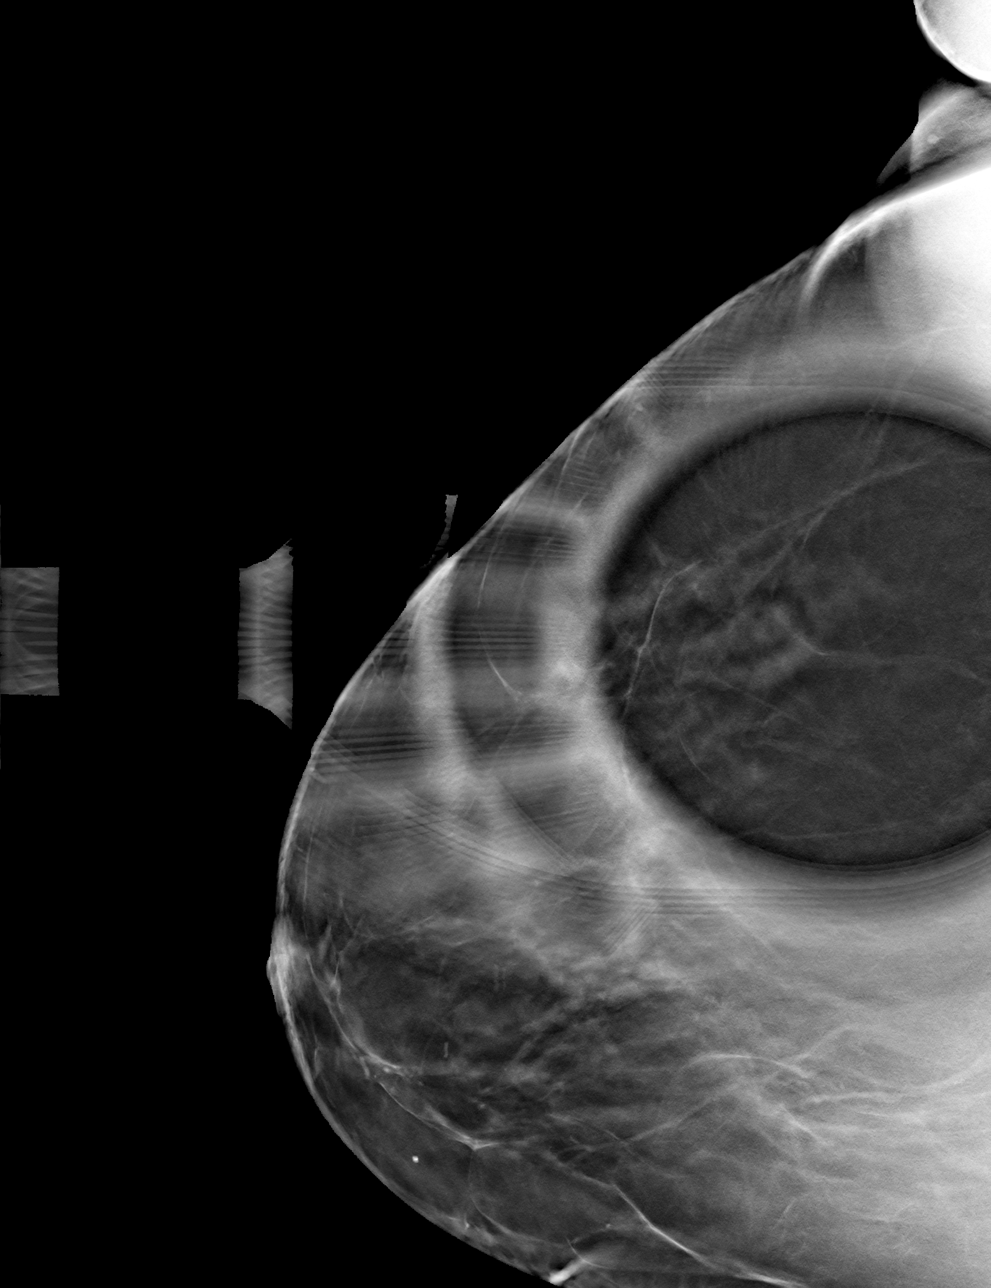

[4 of 12 positions shown; findings below may reference images not displayed]

ACR Breast Density Category b: There are scattered areas of
fibroglandular density.
FINDINGS: 2D/3D full field and spot compression views of the RIGHT breast
demonstrate no persistent suspicious abnormality at the site of the
screening study finding.

Mammographic images were processed with CAD.
IMPRESSION: No persistent suspicious abnormality at the site of the screening
study finding.

RECOMMENDATION:
Bilateral screening mammogram in 1 year.

I have discussed the findings and recommendations with the patient.
If applicable, a reminder letter will be sent to the patient
regarding the next appointment.

BI-RADS CATEGORY  1: Negative.

## 2020-07-20 DIAGNOSIS — G894 Chronic pain syndrome: Secondary | ICD-10-CM | POA: Diagnosis not present

## 2020-07-20 DIAGNOSIS — M7062 Trochanteric bursitis, left hip: Secondary | ICD-10-CM | POA: Diagnosis not present

## 2020-07-21 DIAGNOSIS — Z01812 Encounter for preprocedural laboratory examination: Secondary | ICD-10-CM | POA: Diagnosis not present

## 2020-07-26 DIAGNOSIS — Z8371 Family history of colonic polyps: Secondary | ICD-10-CM | POA: Diagnosis not present

## 2020-07-26 DIAGNOSIS — K5730 Diverticulosis of large intestine without perforation or abscess without bleeding: Secondary | ICD-10-CM | POA: Diagnosis not present

## 2020-07-29 DIAGNOSIS — Z79891 Long term (current) use of opiate analgesic: Secondary | ICD-10-CM | POA: Diagnosis not present

## 2020-07-29 DIAGNOSIS — I10 Essential (primary) hypertension: Secondary | ICD-10-CM | POA: Diagnosis not present

## 2020-07-29 DIAGNOSIS — M4716 Other spondylosis with myelopathy, lumbar region: Secondary | ICD-10-CM | POA: Diagnosis not present

## 2020-07-29 DIAGNOSIS — G894 Chronic pain syndrome: Secondary | ICD-10-CM | POA: Diagnosis not present

## 2020-07-29 DIAGNOSIS — M7062 Trochanteric bursitis, left hip: Secondary | ICD-10-CM | POA: Diagnosis not present

## 2020-07-29 DIAGNOSIS — Z79899 Other long term (current) drug therapy: Secondary | ICD-10-CM | POA: Diagnosis not present

## 2020-07-29 DIAGNOSIS — Z6837 Body mass index (BMI) 37.0-37.9, adult: Secondary | ICD-10-CM | POA: Diagnosis not present

## 2020-08-05 IMAGING — DX DG KNEE 1-2V PORT*L*
2 series · 2 of 2 positions shown · non-contrast
Comparison: 12/02/2013

CLINICAL DATA: Total knee replacement

EXAM:
PORTABLE LEFT KNEE - 1-2 VIEW

[knee ap]
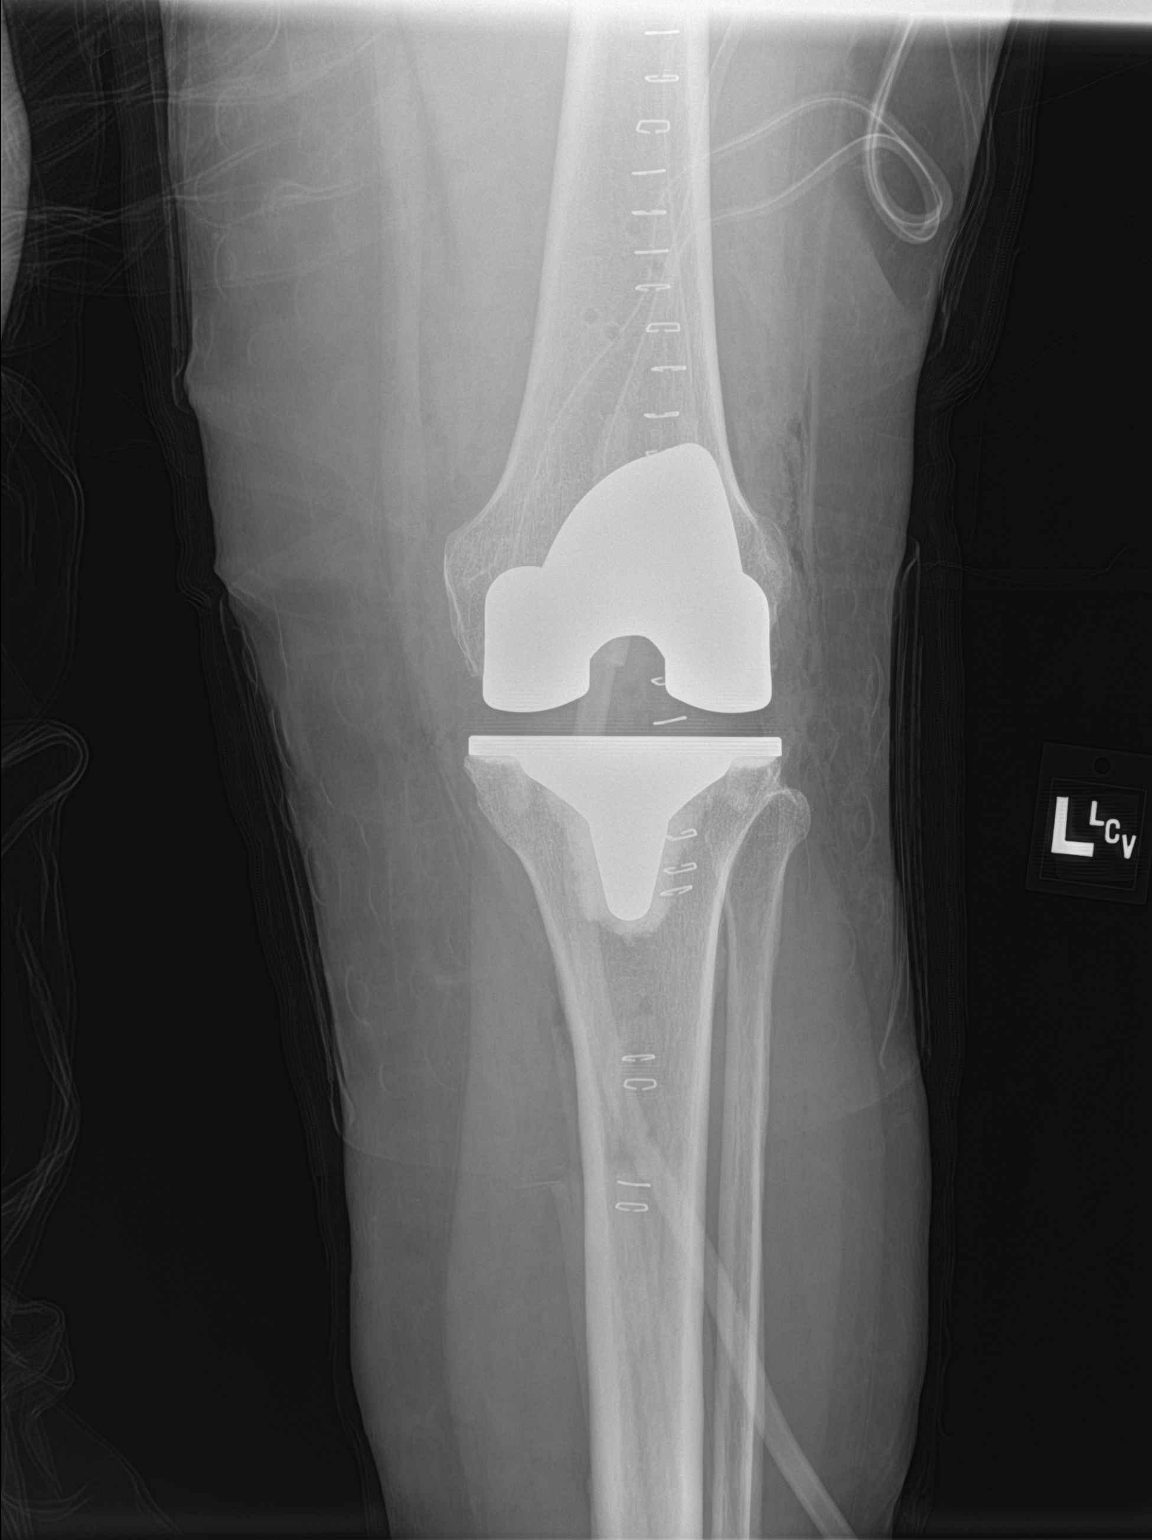

[knee lat]
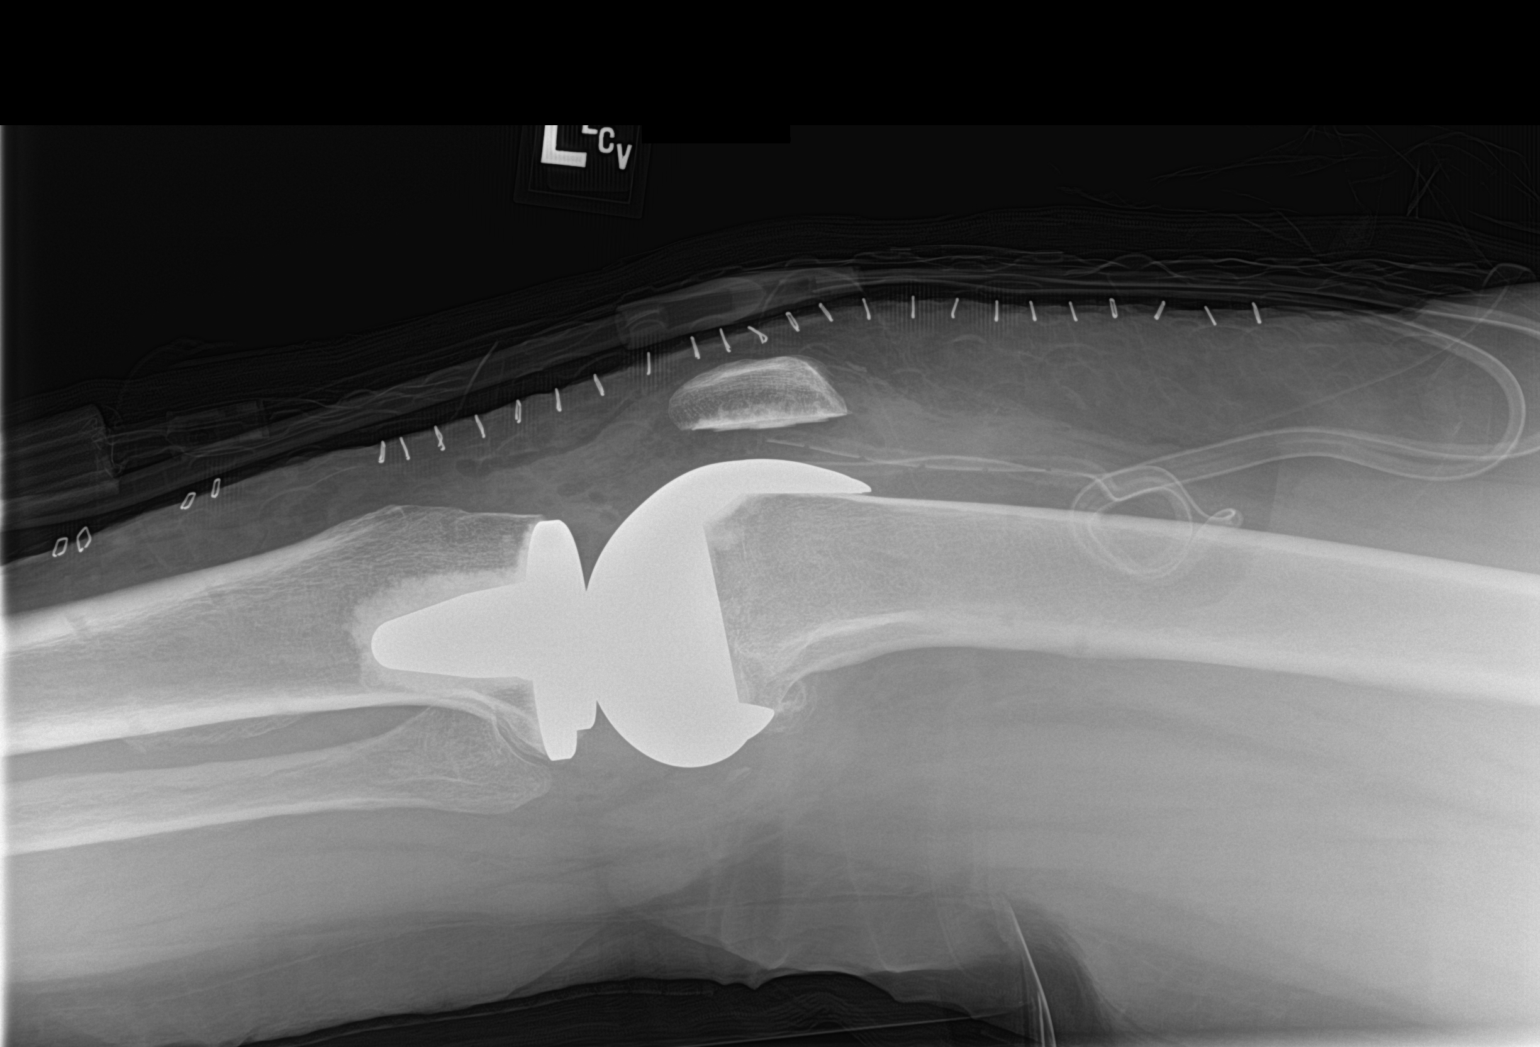

[2 of 2 positions shown; findings below may reference images not displayed]

FINDINGS: Left knee replacement in satisfactory position and alignment. No
fracture or complication. Soft tissue drain in the suprapatellar
bursa.
IMPRESSION: Satisfactory left knee replacement.

## 2020-08-15 ENCOUNTER — Telehealth: Payer: Self-pay

## 2020-08-15 NOTE — Telephone Encounter (Signed)
Patient is scheduled on 09/13/20 with [facility] for next available appointment. Please advise

## 2020-08-15 NOTE — Telephone Encounter (Signed)
Please advise if [place] is ok

## 2020-08-15 NOTE — Telephone Encounter (Signed)
Pt calling triage today c/o post menopausal bleeding. Wanting an appt with [facility]PH asap. Please schedule.

## 2020-08-16 NOTE — Telephone Encounter (Signed)
After looking at your scheduled for tomorrow it looks like it would how to be after 11:40. Please advise [place] time

## 2020-08-16 NOTE — Telephone Encounter (Signed)
Per [facility]PH to bring patient in at 11 am on 08/17/20

## 2020-08-17 ENCOUNTER — Other Ambulatory Visit (HOSPITAL_COMMUNITY)
Admission: RE | Admit: 2020-08-17 | Discharge: 2020-08-17 | Disposition: A | Discharge status: Home / Self Care | Payer: PPO | Source: Ambulatory Visit | Attending: Obstetrics & Gynecology | Admitting: Obstetrics & Gynecology

## 2020-08-17 ENCOUNTER — Encounter: Payer: Self-pay | Admitting: Obstetrics & Gynecology

## 2020-08-17 ENCOUNTER — Ambulatory Visit: Payer: PPO | Admitting: Obstetrics & Gynecology

## 2020-08-17 ENCOUNTER — Other Ambulatory Visit: Payer: Self-pay

## 2020-08-17 VITALS — BP 128/80 | Ht 65.0 in | Wt 237.0 lb

## 2020-08-17 DIAGNOSIS — N950 Postmenopausal bleeding: Secondary | ICD-10-CM | POA: Insufficient documentation

## 2020-08-17 DIAGNOSIS — N840 Polyp of corpus uteri: Secondary | ICD-10-CM | POA: Diagnosis not present

## 2020-08-17 DIAGNOSIS — N858 Other specified noninflammatory disorders of uterus: Secondary | ICD-10-CM | POA: Diagnosis not present

## 2020-08-17 NOTE — Patient Instructions (Signed)

## 2020-08-17 NOTE — Progress Notes (Signed)
Postmenopausal Bleeding Patient is a 64 yo WF whocomplains of vaginal bleeding. She has been menopausal for 20+ years. Currently on no HRT and no blood thinner medicine. Bleeding is described as less flow than a normal period and has occurred 1 times for several days starting last Friday. Other menopausal symptoms include: none. Workup to date: none.  No pain.  Menstrual History: OB History    Gravida  2   Para  2   Term  2   Preterm      AB      Living  2     SAB      TAB      Ectopic      Multiple      Live Births             PMHx: She  has a past medical history of Arthritis, Asthma, Cancer ([place]), DDD (degenerative disc disease), Fracture of capitate of left wrist, Fracture, ankle, History of kidney stones, Hypertension, and UTI (lower urinary tract infection) (12/15/11). Also,  has a past surgical history that includes Back surgery; [place] IN DOCTOR'S OFFICE; Tubal ligation; SKIN GRAFT FROM RIGHT GROIN TO SCALP (1980'S); Knee Arthroplasty (12/21/2011); and Knee Arthroplasty (Left, 06/15/2019)., family history includes Breast cancer in her mother and sister.,  reports that she has quit smoking. She has never used smokeless tobacco. She reports that she does not drink alcohol and does not use drugs.  She has a current medication list which includes the following prescription(s): amlodipine, calcium 500+d3, clonidine, diclofenac sodium, docusate sodium, gabapentin, meloxicam, multivitamin with minerals, and tizanidine. Also, is allergic to penicillins and codeine.  Review of Systems  Constitutional: Negative for chills, fever and malaise/fatigue.  HENT: Negative for congestion, sinus pain and sore throat.   Eyes: Negative for blurred vision and pain.  Respiratory: Negative for cough and wheezing.   Cardiovascular: Negative for chest pain and leg swelling.  Gastrointestinal: Negative for abdominal pain, constipation, diarrhea, heartburn,  nausea and vomiting.  Genitourinary: Negative for dysuria, frequency, hematuria and urgency.  Musculoskeletal: Negative for back pain, joint pain, myalgias and neck pain.  Skin: Negative for itching and rash.  Neurological: Negative for dizziness, tremors and weakness.  Endo/Heme/Allergies: Does not bruise/bleed easily.  Psychiatric/Behavioral: Negative for depression. The patient is not nervous/anxious and does not have insomnia.     Objective: BP 128/80   Ht 5\' 5"  (1.651 m)   Wt 237 lb (107.5 kg)   BMI 39.44 kg/m  Physical Exam Constitutional:      General: She is not in acute distress.    Appearance: She is well-developed.  Genitourinary:     Pelvic exam was performed with patient supine.     Vagina and uterus normal.     No vaginal erythema or bleeding.     No cervical motion tenderness, discharge, polyp or nabothian cyst.     Uterus is mobile.     Uterus is not enlarged.     No uterine mass detected.    Uterus is midaxial.     No right or left adnexal mass present.     Right adnexa not tender.     Left adnexa not tender.  HENT:     Head: Normocephalic and atraumatic.     Nose: Nose normal.  Abdominal:     General: There is no distension.     Palpations: Abdomen is soft.     Tenderness: There is no abdominal tenderness.  Musculoskeletal:  General: Normal range of motion.  Neurological:     Mental Status: She is alert and oriented to person, place, and time.     Cranial Nerves: No cranial nerve deficit.  Skin:    General: Skin is warm and dry.  Psychiatric:        Attention and Perception: Attention normal.        Mood and Affect: Mood and affect normal.        Speech: Speech normal.        Behavior: Behavior normal.        Thought Content: Thought content normal.        Judgment: Judgment normal.     ASSESSMENT/PLAN:    Problem List Items Addressed This Visit    Post-menopausal bleeding    -  Primary   Relevant Orders   Surgical pathology      Plan- [place] and EMB to assess for uterine pathology and cancer DIscussed risks of uterine cancer Call w results, [place] in near future  Endometrial Biopsy After discussion with the patient regarding her abnormal uterine bleeding I recommended that she proceed with an endometrial biopsy for further diagnosis. The risks, benefits, alternatives, and indications for an endometrial biopsy were discussed with the patient in detail. She understood the risks including infection, bleeding, cervical laceration and uterine perforation.  Verbal consent was obtained.   PROCEDURE NOTE:  [place] endometrial biopsy was performed using aseptic technique with iodine preparation.  The uterus was sounded to a length of 7 cm.  Adequate sampling was obtained with minimal blood loss.  The patient tolerated the procedure well.  Disposition will be pending pathology.  Barnett Applebaum, MD, [facility], [facility] 08/17/2020  11:33 AM

## 2020-08-22 LAB — SURGICAL PATHOLOGY

## 2020-08-22 NOTE — Progress Notes (Signed)
LM EMB normal, reassuring Plan [place] f/u in Dec  Barnett Applebaum, MD, [facility], [facility] 08/22/2020  3:53 PM

## 2020-08-24 DIAGNOSIS — G894 Chronic pain syndrome: Secondary | ICD-10-CM | POA: Diagnosis not present

## 2020-08-24 DIAGNOSIS — M7062 Trochanteric bursitis, left hip: Secondary | ICD-10-CM | POA: Diagnosis not present

## 2020-08-29 DIAGNOSIS — R400 Somnolence: Secondary | ICD-10-CM | POA: Diagnosis not present

## 2020-08-29 DIAGNOSIS — K219 Gastro-esophageal reflux disease without esophagitis: Secondary | ICD-10-CM | POA: Diagnosis not present

## 2020-08-29 DIAGNOSIS — I10 Essential (primary) hypertension: Secondary | ICD-10-CM | POA: Diagnosis not present

## 2020-09-13 ENCOUNTER — Ambulatory Visit (INDEPENDENT_AMBULATORY_CARE_PROVIDER_SITE_OTHER): Payer: PPO

## 2020-09-13 ENCOUNTER — Ambulatory Visit (INDEPENDENT_AMBULATORY_CARE_PROVIDER_SITE_OTHER): Payer: PPO | Admitting: Obstetrics & Gynecology

## 2020-09-13 ENCOUNTER — Ambulatory Visit: Payer: PPO | Admitting: Obstetrics & Gynecology

## 2020-09-13 ENCOUNTER — Encounter: Payer: Self-pay | Admitting: Obstetrics & Gynecology

## 2020-09-13 ENCOUNTER — Other Ambulatory Visit: Payer: Self-pay | Admitting: Obstetrics & Gynecology

## 2020-09-13 ENCOUNTER — Other Ambulatory Visit: Payer: Self-pay

## 2020-09-13 VITALS — BP 120/80 | Ht 65.0 in | Wt 234.0 lb

## 2020-09-13 DIAGNOSIS — N950 Postmenopausal bleeding: Secondary | ICD-10-CM

## 2020-09-13 NOTE — Progress Notes (Signed)
  HPI: Pt has had 2 episodes of PMB.  The first was in Nov; like a period; EMB neg after that occurance.  Then in Dec she had light spotting for a week.  [place] today.  Only other new occurance is vaccine booster; no new meds.  Ultrasound demonstrates no masses seen, normal lining These findings are Pelvis normal  PMHx: She  has a past medical history of Arthritis, Asthma, Cancer ([place]), DDD (degenerative disc disease), Fracture of capitate of left wrist, Fracture, ankle, History of kidney stones, Hypertension, and UTI (lower urinary tract infection) (12/15/11). Also,  has a past surgical history that includes Back surgery; [place] IN DOCTOR'S OFFICE; Tubal ligation; SKIN GRAFT FROM RIGHT GROIN TO SCALP (1980'S); Knee Arthroplasty (12/21/2011); and Knee Arthroplasty (Left, 06/15/2019)., family history includes Breast cancer in her mother and sister.,  reports that she has quit smoking. She has never used smokeless tobacco. She reports that she does not drink alcohol and does not use drugs.  She has a current medication list which includes the following prescription(s): amlodipine, calcium 500+d3, clonidine, diclofenac sodium, docusate sodium, gabapentin, meloxicam, multivitamin with minerals, and tizanidine. Also, is allergic to penicillins and codeine.  ROS  Objective: BP 120/80   Ht 5\' 5"  (1.651 m)   Wt 234 lb (106.1 kg)   BMI 38.94 kg/m   Physical examination Constitutional NAD, Conversant  Skin No rashes, lesions or ulceration.   Extremities: Moves all appropriately.  Normal ROM for age. No lymphadenopathy.  Neuro: Grossly intact  Psych: Oriented to PPT.  Normal mood. Normal affect.   US PELVIS TRANSVAGINAL NON-OB (TV ONLY)  Result Date: 09/13/2020 Patient Name: Monica Tyler DOB: 1955-11-10 MRN: [id number] ULTRASOUND REPORT Location: [facility] Date of Service: 09/13/2020 Indications:Postmenopausal Bleeding Findings: The uterus is axial and measures 6.6  x 4.1 x 3.5 cm. Echo texture is heterogenous without evidence of focal masses. The Endometrium is not clearly visualized. There are nabothian cysts in the cervix. Right Ovary measures 1.9 x 1.4 x 1.1 cm. It is normal in appearance. Left Ovary measures 2.3 x 1.2 x 1.1 cm. It is normal in appearance. Survey of the adnexa demonstrates no adnexal masses. There is no free fluid in the cul de sac. Impression: 1. The endometrium is not clearly visualized. 2. There are nabothian cysts in the cervix. 3. Normal appearing ovaries. Recommendations: 1.Clinical correlation with the patient's History and Physical Exam. Gweneth Dimitri, RT Review of ULTRASOUND.    I have personally reviewed images and report of recent ultrasound done at [facility].    Plan of management to be discussed with patient. Barnett Applebaum, MD, [facility], [facility] 09/13/2020  11:16 AM   Assessment:  Post-menopausal bleeding Monitor pattern for now.  No s/sx cancer.  No evidence for polyp.  Risk of side effect to covid booster unknown but discussed.  If bleeding persists, then consider hysteroscopy or hysterectomy.   A total of 20 minutes were spent face-to-face with the patient as well as preparation, review, communication, and documentation during this encounter.   Barnett Applebaum, MD, [facility], [facility] 09/13/2020  11:16 AM

## 2020-09-28 DIAGNOSIS — M4716 Other spondylosis with myelopathy, lumbar region: Secondary | ICD-10-CM | POA: Diagnosis not present

## 2020-09-28 DIAGNOSIS — Z6837 Body mass index (BMI) 37.0-37.9, adult: Secondary | ICD-10-CM | POA: Diagnosis not present

## 2020-09-28 DIAGNOSIS — M791 Myalgia, unspecified site: Secondary | ICD-10-CM | POA: Diagnosis not present

## 2020-09-28 DIAGNOSIS — G894 Chronic pain syndrome: Secondary | ICD-10-CM | POA: Diagnosis not present

## 2020-10-26 DIAGNOSIS — M4716 Other spondylosis with myelopathy, lumbar region: Secondary | ICD-10-CM | POA: Diagnosis not present

## 2020-10-26 DIAGNOSIS — G894 Chronic pain syndrome: Secondary | ICD-10-CM | POA: Diagnosis not present

## 2020-10-26 DIAGNOSIS — M5106 Intervertebral disc disorders with myelopathy, lumbar region: Secondary | ICD-10-CM | POA: Diagnosis not present

## 2020-10-26 DIAGNOSIS — M791 Myalgia, unspecified site: Secondary | ICD-10-CM | POA: Diagnosis not present

## 2020-11-28 DIAGNOSIS — G894 Chronic pain syndrome: Secondary | ICD-10-CM | POA: Diagnosis not present

## 2020-11-28 DIAGNOSIS — M5106 Intervertebral disc disorders with myelopathy, lumbar region: Secondary | ICD-10-CM | POA: Diagnosis not present

## 2020-12-16 ENCOUNTER — Other Ambulatory Visit: Payer: Self-pay | Admitting: Orthopaedic Surgery

## 2020-12-16 ENCOUNTER — Telehealth: Payer: Self-pay

## 2020-12-16 DIAGNOSIS — M5106 Intervertebral disc disorders with myelopathy, lumbar region: Secondary | ICD-10-CM

## 2020-12-16 NOTE — Telephone Encounter (Signed)
Phone call to patient to verify medication list and allergies for myelogram procedure. Medications pt is currently taking are safe to continue to take. Advised pt if any new medications are started prior to procedure to call and make [place] aware. Pt also instructed to have a driver the day of the procedure, she would be with [place] around 2 hours, and discharge instructions. Pt verbalized understanding.

## 2020-12-20 ENCOUNTER — Ambulatory Visit
Admission: RE | Admit: 2020-12-20 | Discharge: 2020-12-20 | Disposition: A | Discharge status: Home / Self Care | Payer: Medicare Other | Source: Ambulatory Visit | Attending: Orthopaedic Surgery | Admitting: Orthopaedic Surgery

## 2020-12-20 DIAGNOSIS — M5126 Other intervertebral disc displacement, lumbar region: Secondary | ICD-10-CM | POA: Diagnosis not present

## 2020-12-20 DIAGNOSIS — M8938 Hypertrophy of bone, other site: Secondary | ICD-10-CM | POA: Diagnosis not present

## 2020-12-20 DIAGNOSIS — M5106 Intervertebral disc disorders with myelopathy, lumbar region: Secondary | ICD-10-CM

## 2020-12-20 DIAGNOSIS — M4326 Fusion of spine, lumbar region: Secondary | ICD-10-CM | POA: Diagnosis not present

## 2020-12-20 DIAGNOSIS — M5125 Other intervertebral disc displacement, thoracolumbar region: Secondary | ICD-10-CM | POA: Diagnosis not present

## 2020-12-20 DIAGNOSIS — M4327 Fusion of spine, lumbosacral region: Secondary | ICD-10-CM | POA: Diagnosis not present

## 2020-12-20 MED ORDER — IOPAMIDOL (ISOVUE-M 200) INJECTION 41%
15.0000 mL | Freq: Once | INTRAMUSCULAR | Status: AC
  Administered 2020-12-20: 15 mL via INTRATHECAL

## 2020-12-20 MED ORDER — DIAZEPAM 5 MG PO TABS
5.0000 mg | ORAL_TABLET | Freq: Once | ORAL | Status: AC
  Administered 2020-12-20: 5 mg via ORAL

## 2020-12-20 NOTE — Discharge Instructions (Signed)

## 2020-12-23 ENCOUNTER — Other Ambulatory Visit: Payer: Self-pay

## 2020-12-23 ENCOUNTER — Ambulatory Visit
Admission: RE | Admit: 2020-12-23 | Discharge: 2020-12-23 | Disposition: A | Discharge status: Home / Self Care | Payer: Medicare Other | Source: Ambulatory Visit | Attending: Surgery | Admitting: Surgery

## 2020-12-23 ENCOUNTER — Other Ambulatory Visit: Payer: Self-pay | Admitting: Surgery

## 2020-12-23 DIAGNOSIS — G971 Other reaction to spinal and lumbar puncture: Secondary | ICD-10-CM | POA: Diagnosis not present

## 2020-12-23 DIAGNOSIS — R519 Headache, unspecified: Secondary | ICD-10-CM

## 2020-12-23 DIAGNOSIS — T8189XA Other complications of procedures, not elsewhere classified, initial encounter: Secondary | ICD-10-CM

## 2020-12-23 MED ORDER — IOPAMIDOL (ISOVUE-M 200) INJECTION 41%
1.0000 mL | Freq: Once | INTRAMUSCULAR | Status: AC
  Administered 2020-12-23: 1 mL via EPIDURAL

## 2020-12-23 NOTE — Discharge Instructions (Signed)

## 2020-12-28 DIAGNOSIS — G894 Chronic pain syndrome: Secondary | ICD-10-CM | POA: Diagnosis not present

## 2020-12-28 DIAGNOSIS — M5106 Intervertebral disc disorders with myelopathy, lumbar region: Secondary | ICD-10-CM | POA: Diagnosis not present

## 2020-12-28 DIAGNOSIS — M4716 Other spondylosis with myelopathy, lumbar region: Secondary | ICD-10-CM | POA: Diagnosis not present

## 2020-12-28 DIAGNOSIS — M791 Myalgia, unspecified site: Secondary | ICD-10-CM | POA: Diagnosis not present

## 2021-01-10 DIAGNOSIS — M5116 Intervertebral disc disorders with radiculopathy, lumbar region: Secondary | ICD-10-CM | POA: Diagnosis not present

## 2021-01-24 DIAGNOSIS — M5116 Intervertebral disc disorders with radiculopathy, lumbar region: Secondary | ICD-10-CM | POA: Diagnosis not present

## 2021-01-24 DIAGNOSIS — G894 Chronic pain syndrome: Secondary | ICD-10-CM | POA: Diagnosis not present

## 2021-02-23 DIAGNOSIS — G894 Chronic pain syndrome: Secondary | ICD-10-CM | POA: Diagnosis not present

## 2021-02-23 DIAGNOSIS — M5116 Intervertebral disc disorders with radiculopathy, lumbar region: Secondary | ICD-10-CM | POA: Diagnosis not present

## 2021-02-28 DIAGNOSIS — R21 Rash and other nonspecific skin eruption: Secondary | ICD-10-CM | POA: Diagnosis not present

## 2021-02-28 DIAGNOSIS — I872 Venous insufficiency (chronic) (peripheral): Secondary | ICD-10-CM | POA: Diagnosis not present

## 2021-03-23 DIAGNOSIS — G894 Chronic pain syndrome: Secondary | ICD-10-CM | POA: Diagnosis not present

## 2021-03-23 DIAGNOSIS — M5116 Intervertebral disc disorders with radiculopathy, lumbar region: Secondary | ICD-10-CM | POA: Diagnosis not present

## 2021-04-04 ENCOUNTER — Other Ambulatory Visit: Payer: Self-pay | Admitting: Obstetrics & Gynecology

## 2021-04-04 DIAGNOSIS — Z1231 Encounter for screening mammogram for malignant neoplasm of breast: Secondary | ICD-10-CM

## 2021-04-10 ENCOUNTER — Other Ambulatory Visit: Payer: Self-pay

## 2021-04-10 ENCOUNTER — Ambulatory Visit (INDEPENDENT_AMBULATORY_CARE_PROVIDER_SITE_OTHER): Payer: Medicare Other | Admitting: Obstetrics & Gynecology

## 2021-04-10 ENCOUNTER — Encounter: Payer: Self-pay | Admitting: Obstetrics & Gynecology

## 2021-04-10 VITALS — BP 140/80 | Ht 65.0 in | Wt 240.0 lb

## 2021-04-10 DIAGNOSIS — Z01419 Encounter for gynecological examination (general) (routine) without abnormal findings: Secondary | ICD-10-CM

## 2021-04-10 DIAGNOSIS — Z1211 Encounter for screening for malignant neoplasm of colon: Secondary | ICD-10-CM | POA: Diagnosis not present

## 2021-04-10 MED ORDER — CLOTRIMAZOLE-BETAMETHASONE 1-0.05 % EX CREA: 1.0000 "application " | TOPICAL_CREAM | Freq: Two times a day (BID) | CUTANEOUS | 0 refills | Status: AC
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
## 2021-04-10 NOTE — Patient Instructions (Addendum)
Clotrimazole; Betamethasone Skin Cream Daily for 2 weeks Then As needed  What is this medication? CLOTRIMAZOLE; BETAMETHASONE (kloe TRIM a zole; bay ta METH a sone) is a corticosteroid and antifungal cream. It treats ringworm and infections like jock itch and athlete's foot. It also helps reduce swelling, redness, anditching caused by these infections. This medicine may be used for other purposes; ask your health care provider orpharmacist if you have questions. COMMON BRAND NAME(S): Lotrisone What should I tell my care team before I take this medication? They need to know if you have any of these conditions: large areas of burned or damaged skin skin thinning peripheral vascular disease or poor circulation an unusual or allergic reaction to clotrimazole, betamethasone, other medicines, foods, dyes, or preservatives pregnant or trying to get pregnant breast-feeding How should I use this medication? This cream is for external use only. Do not take by mouth. Follow the directions on the prescription label. Wash your hands before and after use. If treating hand or nail infections, wash hands before use only. Apply a thin layer of cream to the affected area and rub in gently. Do not cover or wrap the treated area with an airtight bandage (like a plastic bandage). Use the cream for the full course of treatment prescribed, even if you think the condition is getting better. Use the medicine at regular intervals. Do not use more often than directed. Do not use on healthy skin or over large areas of skin. Do not use this medicine for any condition other than the one for which it was prescribed. When applying to the groin area, apply a small amount and do not use for longer than 2 weeks unless directed to by your doctor or health care professional. Do not get this cream in your eyes. If you do, rinse out withplenty of cool tap water. Talk to your pediatrician regarding the use of this medicine in children.  While this drug may be prescribed for children as young as 17 years for selectedconditions, precautions do apply. Patients over 33 years old may have a stronger reaction and need a smaller dose. Overdosage: If you think you have taken too much of this medicine contact apoison control center or emergency room at once. NOTE: This medicine is only for you. Do not share this medicine with others. What if I miss a dose? If you miss a dose, use it as soon as you can. If it is almost time for yournext dose, use only that dose. Do not use double or take extra doses. What may interact with this medication? topical products that have nystatin This list may not describe all possible interactions. Give your health care provider a list of all the medicines, herbs, non-prescription drugs, or dietary supplements you use. Also tell them if you smoke, drink alcohol, or use illegaldrugs. Some items may interact with your medicine. What should I watch for while using this medication? If using this medicine on your body or groin tell your doctor or health care professional if your symptoms do not improve within 1 week. If using this medicine on your feet tell your doctor or health care professional if your symptoms do not improve within 2 weeks. Tell your doctor if your skin infectionreturns after you stop using this cream. If you are using this cream for 'jock itch' be sure to dry the groin completely after bathing. Do not wear underwear that is tight-fitting or made fromsynthetic fibers like rayon or nylon. Wear loose-fitting, cotton underwear. If  you are using this cream for athlete's foot be sure to dry your feet carefully after bathing, especially between the toes. Do not wear socks made from wool or synthetic materials like rayon or nylon. Wear clean cotton socks and change them at least once a day, change them more if your feet sweat a lot.Also, try to wear sandals or shoes that are well-ventilated. Do not use this  cream to treat diaper rash. What side effects may I notice from receiving this medication? Side effects that you should report to your doctor or health care professionalas soon as possible: allergic reactions like skin rash, itching or hives, swelling of the face, lips, or tongue dark red spots on the skin lack of healing of skin condition loss of feeling on skin painful, red, pus-filled blisters in hair follicles skin infection sores or blisters that do not heal properly thinning of the skin or sunburn Side effects that usually do not require medical attention (report to yourdoctor or health care professional if they continue or are bothersome): dry or peeling skin minor skin irritation, burning, or itching This list may not describe all possible side effects. Call your doctor for medical advice about side effects. You may report side effects to FDA at1-800-FDA-1088. Where should I keep my medication? Keep out of the reach of children. Store at room temperature between 15 and 30 degrees C ( 59 and 86 degrees F).Do not freeze. Throw away any unused medicine after the expiration date. NOTE: This sheet is a summary. It may not cover all possible information. If you have questions about this medicine, talk to your doctor, pharmacist, orhealth care provider.  2022 Elsevier/Gold Standard (2019-07-28 14:55:22)

## 2021-04-17 DIAGNOSIS — H2513 Age-related nuclear cataract, bilateral: Secondary | ICD-10-CM | POA: Diagnosis not present

## 2021-04-18 DIAGNOSIS — Z1211 Encounter for screening for malignant neoplasm of colon: Secondary | ICD-10-CM | POA: Diagnosis not present

## 2021-04-20 ENCOUNTER — Other Ambulatory Visit: Payer: Self-pay | Admitting: Obstetrics & Gynecology

## 2021-04-20 DIAGNOSIS — R195 Other fecal abnormalities: Secondary | ICD-10-CM

## 2021-04-20 DIAGNOSIS — G894 Chronic pain syndrome: Secondary | ICD-10-CM | POA: Diagnosis not present

## 2021-04-20 DIAGNOSIS — M5116 Intervertebral disc disorders with radiculopathy, lumbar region: Secondary | ICD-10-CM | POA: Diagnosis not present

## 2021-04-20 LAB — SPECIMEN STATUS REPORT

## 2021-04-20 LAB — FECAL OCCULT BLOOD, IMMUNOCHEMICAL: Fecal Occult Bld: POSITIVE — AB

## 2021-05-15 ENCOUNTER — Ambulatory Visit
Admission: RE | Admit: 2021-05-15 | Discharge: 2021-05-15 | Disposition: A | Discharge status: Home / Self Care | Payer: Medicare Other | Source: Ambulatory Visit | Attending: Obstetrics & Gynecology | Admitting: Obstetrics & Gynecology

## 2021-05-15 ENCOUNTER — Other Ambulatory Visit: Payer: Self-pay

## 2021-05-15 DIAGNOSIS — Z1231 Encounter for screening mammogram for malignant neoplasm of breast: Secondary | ICD-10-CM | POA: Diagnosis not present

## 2021-05-30 DIAGNOSIS — G894 Chronic pain syndrome: Secondary | ICD-10-CM | POA: Diagnosis not present

## 2021-05-30 DIAGNOSIS — M5116 Intervertebral disc disorders with radiculopathy, lumbar region: Secondary | ICD-10-CM | POA: Diagnosis not present

## 2021-06-02 DIAGNOSIS — Z23 Encounter for immunization: Secondary | ICD-10-CM | POA: Diagnosis not present

## 2021-06-02 DIAGNOSIS — Z0000 Encounter for general adult medical examination without abnormal findings: Secondary | ICD-10-CM | POA: Diagnosis not present

## 2021-06-02 DIAGNOSIS — Z1389 Encounter for screening for other disorder: Secondary | ICD-10-CM | POA: Diagnosis not present

## 2021-06-02 DIAGNOSIS — M545 Low back pain, unspecified: Secondary | ICD-10-CM | POA: Diagnosis not present

## 2021-06-02 DIAGNOSIS — R7301 Impaired fasting glucose: Secondary | ICD-10-CM | POA: Diagnosis not present

## 2021-06-02 DIAGNOSIS — E782 Mixed hyperlipidemia: Secondary | ICD-10-CM | POA: Diagnosis not present

## 2021-06-02 DIAGNOSIS — I10 Essential (primary) hypertension: Secondary | ICD-10-CM | POA: Diagnosis not present

## 2021-06-08 DIAGNOSIS — Z96651 Presence of right artificial knee joint: Secondary | ICD-10-CM | POA: Diagnosis not present

## 2021-06-08 DIAGNOSIS — Z96652 Presence of left artificial knee joint: Secondary | ICD-10-CM | POA: Diagnosis not present

## 2021-06-08 DIAGNOSIS — Z96653 Presence of artificial knee joint, bilateral: Secondary | ICD-10-CM | POA: Diagnosis not present

## 2021-06-09 ENCOUNTER — Other Ambulatory Visit: Payer: Self-pay | Admitting: Obstetrics & Gynecology

## 2021-06-28 DIAGNOSIS — Z79899 Other long term (current) drug therapy: Secondary | ICD-10-CM | POA: Diagnosis not present

## 2021-06-28 DIAGNOSIS — M5106 Intervertebral disc disorders with myelopathy, lumbar region: Secondary | ICD-10-CM | POA: Diagnosis not present

## 2021-06-28 DIAGNOSIS — M542 Cervicalgia: Secondary | ICD-10-CM | POA: Diagnosis not present

## 2021-06-28 DIAGNOSIS — G894 Chronic pain syndrome: Secondary | ICD-10-CM | POA: Diagnosis not present

## 2021-07-28 DIAGNOSIS — M542 Cervicalgia: Secondary | ICD-10-CM | POA: Diagnosis not present

## 2021-07-28 DIAGNOSIS — M5106 Intervertebral disc disorders with myelopathy, lumbar region: Secondary | ICD-10-CM | POA: Diagnosis not present

## 2021-07-28 DIAGNOSIS — G894 Chronic pain syndrome: Secondary | ICD-10-CM | POA: Diagnosis not present

## 2021-08-25 DIAGNOSIS — G894 Chronic pain syndrome: Secondary | ICD-10-CM | POA: Diagnosis not present

## 2021-08-25 DIAGNOSIS — M5106 Intervertebral disc disorders with myelopathy, lumbar region: Secondary | ICD-10-CM | POA: Diagnosis not present

## 2021-09-25 ENCOUNTER — Other Ambulatory Visit: Payer: Self-pay | Admitting: Obstetrics & Gynecology

## 2021-09-25 DIAGNOSIS — R61 Generalized hyperhidrosis: Secondary | ICD-10-CM

## 2021-09-28 ENCOUNTER — Other Ambulatory Visit: Payer: Self-pay | Admitting: Orthopaedic Surgery

## 2021-09-28 DIAGNOSIS — M542 Cervicalgia: Secondary | ICD-10-CM

## 2021-09-28 DIAGNOSIS — M47892 Other spondylosis, cervical region: Secondary | ICD-10-CM

## 2021-10-10 ENCOUNTER — Ambulatory Visit
Admission: RE | Admit: 2021-10-10 | Discharge: 2021-10-10 | Disposition: A | Discharge status: Home / Self Care | Payer: Medicare Other | Source: Ambulatory Visit | Attending: Orthopaedic Surgery | Admitting: Orthopaedic Surgery

## 2021-10-10 DIAGNOSIS — M542 Cervicalgia: Secondary | ICD-10-CM

## 2021-10-10 DIAGNOSIS — M47892 Other spondylosis, cervical region: Secondary | ICD-10-CM

## 2021-11-07 DIAGNOSIS — M150 Primary generalized (osteo)arthritis: Secondary | ICD-10-CM | POA: Insufficient documentation

## 2021-11-07 DIAGNOSIS — E66812 Obesity, class 2: Secondary | ICD-10-CM | POA: Insufficient documentation

## 2021-11-07 DIAGNOSIS — Z87442 Personal history of urinary calculi: Secondary | ICD-10-CM | POA: Insufficient documentation

## 2021-11-07 DIAGNOSIS — M51369 Other intervertebral disc degeneration, lumbar region without mention of lumbar back pain or lower extremity pain: Secondary | ICD-10-CM | POA: Insufficient documentation

## 2021-11-24 DIAGNOSIS — M50022 Cervical disc disorder at C5-C6 level with myelopathy: Secondary | ICD-10-CM | POA: Insufficient documentation

## 2022-01-02 DIAGNOSIS — E782 Mixed hyperlipidemia: Secondary | ICD-10-CM | POA: Insufficient documentation

## 2022-01-02 DIAGNOSIS — N200 Calculus of kidney: Secondary | ICD-10-CM | POA: Insufficient documentation

## 2022-01-02 DIAGNOSIS — C539 Malignant neoplasm of cervix uteri, unspecified: Secondary | ICD-10-CM | POA: Insufficient documentation

## 2022-01-02 DIAGNOSIS — R1310 Dysphagia, unspecified: Secondary | ICD-10-CM | POA: Insufficient documentation

## 2022-01-02 DIAGNOSIS — I10 Essential (primary) hypertension: Secondary | ICD-10-CM | POA: Insufficient documentation

## 2022-01-02 DIAGNOSIS — K219 Gastro-esophageal reflux disease without esophagitis: Secondary | ICD-10-CM | POA: Insufficient documentation

## 2022-01-10 HISTORY — PX: ANTERIOR CERVICAL DECOMP/DISCECTOMY FUSION: SHX1161

## 2022-05-03 ENCOUNTER — Encounter: Payer: Self-pay | Admitting: Obstetrics & Gynecology

## 2022-05-03 ENCOUNTER — Ambulatory Visit (INDEPENDENT_AMBULATORY_CARE_PROVIDER_SITE_OTHER): Payer: Medicare Other | Admitting: Obstetrics & Gynecology

## 2022-05-03 VITALS — BP 128/80 | HR 84 | Resp 16 | Ht 65.0 in | Wt 231.8 lb

## 2022-05-03 DIAGNOSIS — Z01419 Encounter for gynecological examination (general) (routine) without abnormal findings: Secondary | ICD-10-CM | POA: Insufficient documentation

## 2022-05-03 DIAGNOSIS — Z1231 Encounter for screening mammogram for malignant neoplasm of breast: Secondary | ICD-10-CM

## 2022-05-03 NOTE — Patient Instructions (Signed)
Mammogram ordered Keep appt as scheduled

## 2022-05-03 NOTE — Progress Notes (Signed)
Wants a mammogram and gyn annual   Subjective:     Monica Tyler is a 66 y.o. [id number]  here for a routine exam.  Current complaints: occasional night sweats .  Personal health questionnaire reviewed: yes.   Gynecologic History No LMP recorded. Patient is postmenopausal. Contraception: post menopausal status /Last Pap: 04/07/2020. Results were: neg. Last mammogram: 05/15/2021. Results were: normal  Obstetric History OB History  Gravida Para Term Preterm AB Living  '2 2 2     2  '$ SAB IAB Ectopic Multiple Live Births               # Outcome Date GA Lbr Len/2nd Weight Sex Delivery Anes PTL Lv  2 Term 04/10/79    F Vag-Spont     1 Term 01/07/76 [redacted]w[redacted]d  M Vag-Spont        The following portions of the patient's history were reviewed and updated as appropriate: allergies, current medications, past family history, past medical history, past social history, past surgical history, and problem list.  Review of Systems A comprehensive review of systems was negative.    Objective:    BP 128/80   Pulse 84   Resp 16   Ht '5\' 5"'$  (1.651 m) Comment: patient reports  Wt 231 lb 12.8 oz (105.1 kg)   SpO2 96%   BMI 38.57 kg/m   General Appearance:    Alert, cooperative, no distress, appears stated age  Head:    Normocephalic, without obvious abnormality, atraumatic  Eyes:    PERRL, conjunctiva/corneas clear, EOM's intact, fundi    benign, both eyes                 Lungs:     Clear to auscultation bilaterally, respirations unlabored      Heart:    Regular rate and rhythm,   Breast Exam:    No tenderness, masses, or nipple abnormality  Abdomen:     Soft, non-tender, bowel sounds active all four quadrants,    no masses, no organomegaly  Genitalia:    Normal female without lesion, discharge or tenderness           Skin:   Skin color, texture, turgor normal, no rashes or lesions  Lymph nodes:   Cervical, supraclavicular, and axillary nodes normal  Neurologic:   CNII-XII intact, normal  strength, sensation and reflexes    throughout      Assessment:    Healthy female exam.    Plan:    Mammogram ordered.

## 2022-05-30 ENCOUNTER — Ambulatory Visit
Admission: RE | Admit: 2022-05-30 | Discharge: 2022-05-30 | Disposition: A | Discharge status: Home / Self Care | Payer: Medicare Other | Source: Ambulatory Visit | Attending: Obstetrics & Gynecology | Admitting: Obstetrics & Gynecology

## 2022-05-30 DIAGNOSIS — Z1231 Encounter for screening mammogram for malignant neoplasm of breast: Secondary | ICD-10-CM | POA: Diagnosis not present

## 2022-06-09 ENCOUNTER — Other Ambulatory Visit: Payer: Self-pay

## 2022-06-09 ENCOUNTER — Emergency Department (HOSPITAL_COMMUNITY)
Admission: EM | Admit: 2022-06-09 | Discharge: 2022-06-10 | Discharge status: Against Medical Advice | Payer: Medicare Other | Attending: Emergency Medicine | Admitting: Emergency Medicine

## 2022-06-09 ENCOUNTER — Encounter (HOSPITAL_COMMUNITY): Payer: Self-pay | Admitting: Emergency Medicine

## 2022-06-09 DIAGNOSIS — Z5321 Procedure and treatment not carried out due to patient leaving prior to being seen by health care provider: Secondary | ICD-10-CM | POA: Insufficient documentation

## 2022-06-09 DIAGNOSIS — R109 Unspecified abdominal pain: Secondary | ICD-10-CM | POA: Insufficient documentation

## 2022-06-09 LAB — CBC
HCT: 44.4 % (ref 36.0–46.0)
Hemoglobin: 14.6 g/dL (ref 12.0–15.0)
MCH: 32.8 pg (ref 26.0–34.0)
MCHC: 32.9 g/dL (ref 30.0–36.0)
MCV: 99.8 fL (ref 80.0–100.0)
Platelets: 202 10*3/uL (ref 150–400)
RBC: 4.45 MIL/uL (ref 3.87–5.11)
RDW: 12.3 % (ref 11.5–15.5)
WBC: 8.5 10*3/uL (ref 4.0–10.5)
nRBC: 0 % (ref 0.0–0.2)

## 2022-06-09 LAB — URINALYSIS, ROUTINE W REFLEX MICROSCOPIC
Bilirubin Urine: NEGATIVE
Glucose, UA: NEGATIVE mg/dL
Hgb urine dipstick: NEGATIVE
Ketones, ur: NEGATIVE mg/dL
Leukocytes,Ua: NEGATIVE
Nitrite: NEGATIVE
Protein, ur: NEGATIVE mg/dL
Specific Gravity, Urine: 1.024 (ref 1.005–1.030)
pH: 6 (ref 5.0–8.0)

## 2022-06-09 LAB — COMPREHENSIVE METABOLIC PANEL
ALT: 41 U/L (ref 0–44)
AST: 35 U/L (ref 15–41)
Albumin: 4.1 g/dL (ref 3.5–5.0)
Alkaline Phosphatase: 104 U/L (ref 38–126)
Anion gap: 9 (ref 5–15)
BUN: 17 mg/dL (ref 8–23)
CO2: 24 mmol/L (ref 22–32)
Calcium: 9.6 mg/dL (ref 8.9–10.3)
Chloride: 105 mmol/L (ref 98–111)
Creatinine, Ser: 1.16 mg/dL — ABNORMAL HIGH (ref 0.44–1.00)
GFR, Estimated: 51 mL/min — ABNORMAL LOW (ref >60)
Glucose, Bld: 120 mg/dL — ABNORMAL HIGH (ref 70–99)
Potassium: 4.4 mmol/L (ref 3.5–5.1)
Sodium: 138 mmol/L (ref 135–145)
Total Bilirubin: 0.7 mg/dL (ref 0.3–1.2)
Total Protein: 7.3 g/dL (ref 6.5–8.1)

## 2022-06-09 LAB — LIPASE, BLOOD: Lipase: 18 U/L (ref 11–51)

## 2022-06-09 NOTE — ED Triage Notes (Signed)
Patient here with complaint of LLQ abdominal pain that started after straining to have a bowel movement at approximately 1500 today. Patient also reports less severe but same location pain in the same location last night that resolved spontaneously. Patient is alert, oriented, and in no apparent distress at this time.

## 2022-06-09 NOTE — ED Notes (Signed)
Patient states pain is better and she is leaving

## 2022-06-09 NOTE — ED Provider Triage Note (Signed)
Emergency Medicine Provider Triage Evaluation Note  Monica Tyler , a 66 y.o. female  was evaluated in triage.  Pt complains of intermittent abdominal pain for the past month.  Pain got worse today at approximately 3 PM when she was straining on the toilet to have a bowel movement.  She has a history of constipation due to the medication she takes.  She has not missed any doses of her stool softener, but states her stools are still hard.  Review of Systems  Positive: As above Negative: Chest pain, shortness of breath, fevers, nausea, vomiting, hematemesis, melena, hematochezia  Physical Exam  BP (!) 162/89 (BP Location: Right Arm)   Pulse 84   Resp 16   SpO2 98%  Gen:   Awake, no distress   Resp:  Normal effort  MSK:   Moves extremities without difficulty  Other:  Abdomen nontender to palpation  Medical Decision Making  Medically screening exam initiated at 4:10 PM.  Appropriate orders placed.  Monica Tyler was informed that the remainder of the evaluation will be completed by another provider, this initial triage assessment does not replace that evaluation, and the importance of remaining in the ED until their evaluation is complete.  We will obtain basic lab work-up, low suspicion for acute abdomen on initial presentation   Roylene Reason, Hershal Coria 06/09/22 1612

## 2023-02-25 ENCOUNTER — Other Ambulatory Visit: Payer: Self-pay

## 2023-02-25 DIAGNOSIS — R7303 Prediabetes: Secondary | ICD-10-CM | POA: Insufficient documentation

## 2023-02-25 DIAGNOSIS — Z1231 Encounter for screening mammogram for malignant neoplasm of breast: Secondary | ICD-10-CM

## 2023-02-25 DIAGNOSIS — E039 Hypothyroidism, unspecified: Secondary | ICD-10-CM | POA: Insufficient documentation

## 2023-06-04 ENCOUNTER — Ambulatory Visit
Admission: RE | Admit: 2023-06-04 | Discharge: 2023-06-04 | Disposition: A | Discharge status: Home / Self Care | Payer: Medicare Other | Source: Ambulatory Visit | Attending: Internal Medicine | Admitting: Internal Medicine

## 2023-06-04 DIAGNOSIS — Z1231 Encounter for screening mammogram for malignant neoplasm of breast: Secondary | ICD-10-CM | POA: Diagnosis present

## 2023-08-09 ENCOUNTER — Encounter
Admission: RE | Admit: 2023-08-09 | Discharge: 2023-08-09 | Disposition: A | Discharge status: Home / Self Care | Payer: Medicare Other | Source: Ambulatory Visit | Attending: Orthopedic Surgery | Admitting: Orthopedic Surgery

## 2023-08-09 ENCOUNTER — Other Ambulatory Visit: Payer: Self-pay

## 2023-08-09 VITALS — Ht 65.0 in | Wt 224.0 lb

## 2023-08-09 DIAGNOSIS — Z01812 Encounter for preprocedural laboratory examination: Secondary | ICD-10-CM

## 2023-08-09 DIAGNOSIS — I10 Essential (primary) hypertension: Secondary | ICD-10-CM | POA: Diagnosis not present

## 2023-08-09 DIAGNOSIS — Z01818 Encounter for other preprocedural examination: Secondary | ICD-10-CM | POA: Insufficient documentation

## 2023-08-09 HISTORY — DX: Other bursitis of hip, left hip: M70.72

## 2023-08-09 HISTORY — DX: Cervical disc disorder, unspecified, unspecified cervical region: M50.90

## 2023-08-09 HISTORY — DX: Prediabetes: R73.03

## 2023-08-09 HISTORY — DX: Cellulitis of chest wall: L03.313

## 2023-08-09 HISTORY — DX: Hypothyroidism, unspecified: E03.9

## 2023-08-09 HISTORY — DX: Gastro-esophageal reflux disease without esophagitis: K21.9

## 2023-08-09 HISTORY — DX: Mixed hyperlipidemia: E78.2

## 2023-08-09 LAB — BASIC METABOLIC PANEL
Anion gap: 9 (ref 5–15)
BUN: 14 mg/dL (ref 8–23)
CO2: 26 mmol/L (ref 22–32)
Calcium: 9.2 mg/dL (ref 8.9–10.3)
Chloride: 105 mmol/L (ref 98–111)
Creatinine, Ser: 0.64 mg/dL (ref 0.44–1.00)
GFR, Estimated: >60 mL/min (ref >60)
Glucose, Bld: 109 mg/dL — ABNORMAL HIGH (ref 70–99)
Potassium: 3.7 mmol/L (ref 3.5–5.1)
Sodium: 140 mmol/L (ref 135–145)

## 2023-08-09 NOTE — Discharge Instructions (Signed)
  Instructions after Hand / Wrist Surgery   Cierrah Dace P. Angie Fava., M.D.    Dept. of Orthopaedics & Sports Medicine [facility] [address] [place], [place]  [address]  Phone: [phone]   Fax: [phone]       [email or web address]       DIET: Drink plenty of non-alcoholic fluids & begin a light diet. Resume your normal diet the day after surgery.  ACTIVITY:  Keep the hand elevated above the level of the elbow. Begin gently moving the fingers on a regular basis to avoid stiffness. Avoid any heavy lifting, pushing, or pulling with the operative hand. Do not drive or operate any equipment until instructed.  WOUND CARE:  Keep the splint/bandage clean and dry.  The splint and stitches will be removed in the office. Continue to use the ice packs periodically to reduce pain and swelling. You may bathe or shower after the stitches are removed at the first office visit following surgery.  MEDICATIONS: You may resume your regular medications. Please take the pain medication as prescribed. Do not take pain medication on an empty stomach. Do not drive or drink alcoholic beverages when taking pain medications.  CALL THE OFFICE FOR: Temperature above 101 degrees Excessive bleeding or drainage on the dressing. Excessive swelling, coldness, or paleness of the fingers. Persistent nausea and vomiting.  FOLLOW-UP:  You should have an appointment to return to the office in 7-10 days after surgery.   REMEMBER: R.I.C.E. = Rest, Ice, Compression, Elevation !     [facility] Department Directory         [email or web address]       [email or web address]          Cardiology  Appointments: [place] - [phone]  Endocrinology  Appointments: [place] [phone] [place] - [phone]  Gastroenterology  Appointments: [place] [phone] [place] - [phone]        General  Surgery   Appointments: [facility]  Internal Medicine/Family Medicine  Appointments: [facility] - [phone] [place] - [phone]  Metabolic and Weigh Loss Surgery  Appointments: [facility]        Neurology  Appointments: [place] [phone] [place] - [phone]  Neurosurgery  Appointments: [place]  Obstetrics & Gynecology  Appointments: [place] [phone] [place] - [phone]        Pediatrics  Appointments: [place] [phone] [place] - [phone]  Physiatry  Appointments: [place] [phone]  Physical Therapy  Appointments: [place] - [phone]        Podiatry  Appointments: [place] [phone] [place] - [phone]  Pulmonology  Appointments: [place]  Rheumatology  Appointments: [place] [phone]         Location: [facility]  [address] [place], [place]  [address]  [place] Location: [facility] [address] [place], [place]  [address]  [place] Location: [facility] [address] [place], [place]  [address]

## 2023-08-09 NOTE — H&P (Signed)
ORTHOPAEDIC HISTORY & PHYSICAL Rosana Farnell, Adelina Mings., MD - 08/01/2023 9:30 AM EST Formatting of this note is different from the original. Images from the original note were not included. Chief Complaint: Chief Complaint Patient presents with Total Joint- Follow Up Left total knee arthroplasty 06/15/19, right total knee arthroplasty 12/21/11 Dr Magnus Ivan, [facility] Bilateral carpal tunnel syndrome  Reason for Visit: The patient is a 67 y.o. female who returns today for reevaluation of both knees. She is 11-1/2 years status post right total knee arthroplasty (performed by Dr. Magnus Ivan) and 4 years status post left total knee arthroplasty. She denies any significant pain, swelling, locking, or giving way of the knees. She is not using any ambulatory aids. She is walking for exercise.  The patient also requested evaluation of both hands. She reports continued numbness and tingling to both hands. The paresthesia awaken her at night. She has not appreciated any improvement with wrist splints. Of note, she had some temporary improvement of the paresthesias after a C4-7 ACDF in April. However, the symptoms have recurred.  Medications: Current Outpatient Medications Medication Sig Dispense Refill amLODIPine (NORVASC) 5 MG tablet Take 1 tablet (5 mg total) by mouth once daily 90 tablet 1 calcium carbonate-vitamin D3 (OS-CAL 500+D) 500 mg(1,250mg ) -400 unit tablet Take 1 tablet by mouth once daily cloNIDine (CATAPRES-TTS) 0.2 mg/24 hr patch Place 1 patch onto the skin every 7 (seven) days 4 patch 11 docusate (COLACE) 250 MG capsule Take 250 mg by mouth once daily as needed for Constipation esomeprazole (NEXIUM) 40 MG DR capsule Take 1 capsule (40 mg total) by mouth once daily 90 capsule 1 FUROsemide (LASIX) 20 MG tablet 3 days a week. Monday, Wed, and Friday. 30 tablet 5 gabapentin (NEURONTIN) 300 MG capsule Take 600 mg by mouth nightly HYDROcodone-acetaminophen (NORCO) 10-325 mg tablet 1 tablet 2  (two) times daily levothyroxine (SYNTHROID) 50 MCG tablet Take 1 tablet (50 mcg total) by mouth once daily Take on an empty stomach with a glass of water at least 30-60 minutes before breakfast. 30 tablet 11 meloxicam (MOBIC) 15 MG tablet Take 15 mg by mouth once daily methocarbamoL (ROBAXIN) 500 MG tablet multivitamin with minerals tablet Take 1 tablet by mouth once daily phentermine (ADIPEX-P) 37.5 mg tablet Take 1 tablet (37.5 mg total) by mouth every morning before breakfast 30 tablet 3 sennosides-docusate (SENOKOT-S) 8.6-50 mg tablet Take 1 tablet by mouth 2 (two) times daily 60 tablet 11  No current facility-administered medications for this visit.  Allergies: Allergies Allergen Reactions Penicillins Swelling and Rash Did it involve swelling of the face/tongue/throat, SOB, or low BP? Unknown Did it involve sudden or severe rash/hives, skin peeling, or any reaction on the inside of your mouth or nose? Unknown Did you need to seek medical attention at a hospital or doctor's office? No When did it last happen? childhood reaction If all above answers are "NO", may proceed with cephalosporin use. Did it involve swelling of the face/tongue/throat, SOB, or low BP? Unknown Did it involve sudden or severe rash/hives, Codeine Nausea And Vomiting  Past Medical History: Past Medical History: Diagnosis Date Arthritis Asthma, unspecified asthma severity, unspecified whether complicated, unspecified whether persistent (HHS-HCC) Chicken pox DDD (degenerative disc disease), lumbar History of cervical cancer Hypertension  Past Surgical History: Past Surgical History: Procedure Laterality Date Right total knee arthroplasty 12/21/2011 Dr. Magnus Ivan Left total knee arthroplasty using computer-assisted navigation 06/15/2019 Dr Ernest Pine C4-5, C5-6, and C6-7 ACDF 01/10/2022 Dr. Sharolyn Douglas BACK SURGERY OTHER SURGERY cryosurgery for cervix-WS SKIN GRAFT FROM  RIGHT GROIN TO SCALP TUBAL  LIGATION  Social History: Social History  Socioeconomic History Marital status: Married Spouse name: Molly Maduro Number of children: 2 Years of education: 12 Highest education level: High school graduate Occupational History Occupation: Retired- Advertising account planner Tobacco Use Smoking status: Former Current packs/day: 0.00 Types: Cigarettes Quit date: 1992 Years since quitting: 32.8 Smokeless tobacco: Never Vaping Use Vaping status: Never Used Substance and Sexual Activity Alcohol use: Never Drug use: Never Sexual activity: Yes Partners: Male  Family History: Family History Problem Relation Name Age of Onset Breast cancer Mother Diabetes type II Mother Dementia Mother Breast cancer Sister Stroke Father Diabetes type II Brother  Review of Systems: A comprehensive 14 point ROS was performed, reviewed, and the pertinent orthopaedic findings are documented in the HPI.  Exam BP 136/78  Ht 162.6 cm (5\' 4" )  Wt (!) 102.1 kg (225 lb)  BMI 38.62 kg/m  General: Well-developed, well-nourished female seen in no acute distress. Even heel to toe gait without significant varus or valgus thrust to either knee.  HEENT: Atraumatic, normocephalic. Pupils are equal and reactive to light. Extraocular motion is intact. Sclera are clear. Oropharynx is clear with moist mucosa.  Neck: Good range of motion. No tenderness to palpation. Spurling`s test is negative.  Lungs: Clear to auscultation bilaterally.  Cardiovascular: Regular rate and rhythm. Normal S1, S2. No murmur . No appreciable gallops or rubs. Peripheral pulses are palpable.  Extremities: Normal shoulder contour. Good range of motion and stability of the shoulders, elbows, and wrists. Tinel`s test at the elbow is negative.  Right Knee: Soft tissue swelling: none Effusion: none Erythema: none Crepitance: none Tenderness: none Alignment: normal Mediolateral laxity: stable Patellar tracking: Good tracking without evidence  of subluxation or tilt Atrophy: VMO and vastus medialis atrophy. Quadriceps tone was fair to good. Range of motion: 0/0/120 degrees  Left Knee: Soft tissue swelling: none Effusion: none Erythema: none Crepitance: none Tenderness: none Alignment: normal Mediolateral laxity: stable Patellar tracking: Good tracking without evidence of subluxation or tilt Atrophy: VMO and vastus medialis atrophy. Quadriceps tone was fair to good. Incision: Healing well without evidence of infection Range of motion: 0/0/123 degrees  Right hand: Tenderness: Negative Erythema: Negative Swelling: Negative Capillary Refill: Normal Thenar atrophy: negative Intrinsic wasting: negative Grip strength: fair to good grip strength Pincer strength: fair to good pincer strength Tinel`s test: positive Phalen`s test: positive Triggering: No gross triggering or locking of the digits Finkelstein`s test: Negative Range of motion: Good range of motion of the digits  Left hand: Tenderness: Negative Erythema: Negative Swelling: Negative Capillary Refill: Normal Thenar atrophy: equivocal Intrinsic wasting: negative Grip strength: fair to good grip strength Pincer strength: fair to good pincer strength Tinel`s test: positive Phalen`s test: positive Triggering: No gross triggering or locking of the digits Finkelstein`s test: Negative Range of motion: Good range of motion of the digits  Vascular: Peripheral pulses are palpable. Good capillary refill. No gross pretibial or ankle edema. Homans test is negative.  Neurologic: Awake, alert , and oriented. Sensory function is intact except for decreased discrimination to pinprick and light touch in a median nerve distribution. Motor strength is judged to be 5/5 except as noted above. No clonus or tremor. Motor coordination is within normal limits.  Radiographs: I ordered and interpreted standing AP, lateral, and sunrise views of the left knee that were obtained  in the office today. Good position of the total knee implants. Good alignment is noted on the AP view. Good cement mantle is appreciated without evidence of  loosening. No evidence of polyethylene wear or osteolysis. No evidence of fracture or dislocation.  I ordered and interpreted standing AP, lateral, and sunrise views of the right knee that were obtained in the office today. Good position of the total knee implants. Good alignment is noted on the AP view. Good cement mantle is appreciated without evidence of loosening. No evidence of polyethylene wear or osteolysis. No evidence of fracture or dislocation.  Nerve conduction studies: EMG/nerve conduction studies performed by Dr. Cristopher Peru on 09/04/2022 were reviewed. There is electrical evidence of median sensorimotor peripheral neuropathy characteristic of bilateral carpal tunnel syndrome superimposed on general sensory peripheral neuropathy.  Impression: Right total knee arthroplasty Left total knee arthroplasty Bilateral carpal tunnel syndrome. left more symptomatic than right  Plan: The findings were discussed in detail with the patient. The importance of a conscientious home exercise program was reviewed with the patient. I again reviewed quadriceps strengthening exercises with the patient.  Conservative treatment options for the hands were reviewed with the patient. We discussed the risks and benefits of surgical intervention. We discussed the risk that carpal tunnel release would not necessarily address the general sensory peripheral neuropathy. The usual perioperative course was also discussed in detail. The patient expressed understanding of the risks and benefits of surgical intervention and would like to proceed with plans for left carpal tunnel release.  I spent a total of 45 minutes in both face-to-face and non-face-to-face activities for this visit on the date of this encounter.  SPECIAL EQUIPMENT: None SPECIAL  STUDIES: EMG/NCV of both upper extremities ACTIVITIES: As tolerated. WORK STATUS: Not applicable. THERAPY: Home exercise program as reviewed with the patient. MEDICATIONS: Requested Prescriptions  No prescriptions requested or ordered in this encounter  FOLLOW-UP: Return for postoperative follow-up.  Keyontay Stolz P. Angie Fava., M.D.  This note was generated in part with voice recognition software and I apologize for any typographical errors that were not detected and corrected.  Electronically signed by Shari Heritage., MD at 08/02/2023 11:35 AM EST

## 2023-08-09 NOTE — Patient Instructions (Addendum)
Your procedure is scheduled on: Wednesday, November 20 Report to the Registration Desk on the 1st floor of the [facility] To find out your arrival time, please call [phone] between 1PM - 3PM on: Tuesday, November 19 If your arrival time is 6:00 am, do not arrive before that time as the [facility] entrance doors do not open until 6:00 am.  REMEMBER: Instructions that are not followed completely may result in serious medical risk, up to and including death; or upon the discretion of your surgeon and anesthesiologist your surgery may need to be rescheduled.  Do not eat food after midnight the night before surgery.  No gum chewing or hard candies.  You may however, drink CLEAR liquids up to 2 hours before you are scheduled to arrive for your surgery. Do not drink anything within 2 hours of your scheduled arrival time.  Clear liquids include: - water  - apple juice without pulp - gatorade (not RED colors) - black coffee or tea (Do NOT add milk or creamers to the coffee or tea) Do NOT drink anything that is not on this list.  In addition, your doctor has ordered for you to drink the provided:  Ensure Pre-Surgery Clear Carbohydrate Drink  Drinking this carbohydrate drink up to two hours before surgery helps to reduce insulin resistance and improve patient outcomes. Please complete drinking 2 hours before scheduled arrival time.  One week prior to surgery: starting today, November 15 Stop meloxicam and Anti-inflammatories (NSAIDS) such as Advil, Aleve, Ibuprofen, Motrin, Naproxen, Naprosyn and Aspirin based products such as Excedrin, Goody's Powder, BC Powder. Stop ANY OVER THE COUNTER supplements until after surgery. Stop multiple vitamin, calcium.  You may however, continue to take Tylenol if needed for pain up until the day of surgery.  Phentermine - hold for 7 days before surgery. Resume AFTER surgery.  Continue taking all of your other prescription medications up until the  day of surgery.  ON THE DAY OF SURGERY ONLY TAKE THESE MEDICATIONS WITH SIPS OF WATER:  Amlodipine Gabapentin Levothyroxine Hydrocodone if needed for pain  No Alcohol for 24 hours before or after surgery.  No Smoking including e-cigarettes for 24 hours before surgery.  No chewable tobacco products for at least 6 hours before surgery.  No nicotine patches on the day of surgery.  Do not use any "recreational" drugs for at least a week (preferably 2 weeks) before your surgery.  Please be advised that the combination of cocaine and anesthesia may have negative outcomes, up to and including death. If you test positive for cocaine, your surgery will be cancelled.  On the morning of surgery brush your teeth with toothpaste and water, you may rinse your mouth with mouthwash if you wish. Do not swallow any toothpaste or mouthwash.  Use CHG Soap as directed on instruction sheet.  Do not wear jewelry, make-up, hairpins, clips or nail polish.  For welded (permanent) jewelry: bracelets, anklets, waist bands, etc.  Please have this removed prior to surgery.  If it is not removed, there is a chance that hospital personnel will need to cut it off on the day of surgery.  Do not wear lotions, powders, or perfumes.   Do not shave body hair from the neck down 48 hours before surgery.  Contact lenses, hearing aids and dentures may not be worn into surgery.  Do not bring valuables to the hospital. [facility] is not responsible for any missing/lost belongings or valuables.   Notify your doctor if there  is any change in your medical condition (cold, fever, infection).  Wear comfortable clothing (specific to your surgery type) to the hospital.  After surgery, you can help prevent lung complications by doing breathing exercises.  Take deep breaths and cough every 1-2 hours.   If you are being discharged the day of surgery, you will not be allowed to drive home. You will need a responsible  individual to drive you home and stay with you for 24 hours after surgery.   If you are taking public transportation, you will need to have a responsible individual with you.  Please call the Pre-admissions Testing Dept. at [phone] if you have any questions about these instructions.  Surgery Visitation Policy:  Patients having surgery or a procedure may have two visitors.  Children under the age of 57 must have an adult with them who is not the patient.      Preparing for Surgery with CHLORHEXIDINE GLUCONATE (CHG) Soap  Chlorhexidine Gluconate (CHG) Soap  o An antiseptic cleaner that kills germs and bonds with the skin to continue killing germs even after washing  o Used for showering the night before surgery and morning of surgery  Before surgery, you can play an important role by reducing the number of germs on your skin.  CHG (Chlorhexidine gluconate) soap is an antiseptic cleanser which kills germs and bonds with the skin to continue killing germs even after washing.  Please do not use if you have an allergy to CHG or antibacterial soaps. If your skin becomes reddened/irritated stop using the CHG.  1. Shower the NIGHT BEFORE SURGERY and the MORNING OF SURGERY with CHG soap.  2. If you choose to wash your hair, wash your hair first as usual with your normal shampoo.  3. After shampooing, rinse your hair and body thoroughly to remove the shampoo.  4. Use CHG as you would any other liquid soap. You can apply CHG directly to the skin and wash gently with a scrungie or a clean washcloth.  5. Apply the CHG soap to your body only from the neck down. Do not use on open wounds or open sores. Avoid contact with your eyes, ears, mouth, and genitals (private parts). Wash face and genitals (private parts) with your normal soap.  6. Wash thoroughly, paying special attention to the area where your surgery will be performed.  7. Thoroughly rinse your body with warm water.  8. Do  not shower/wash with your normal soap after using and rinsing off the CHG soap.  9. Pat yourself dry with a clean towel.  10. Wear clean pajamas to bed the night before surgery.  12. Place clean sheets on your bed the night of your first shower and do not sleep with pets.  13. Shower again with the CHG soap on the day of surgery prior to arriving at the hospital.  14. Do not apply any deodorants/lotions/powders.  15. Please wear clean clothes to the hospital.

## 2023-08-13 MED ORDER — CEFAZOLIN SODIUM-DEXTROSE 2-4 GM/100ML-% IV SOLN
2.0000 g | INTRAVENOUS | Status: AC
  Administered 2023-08-14: 2 g via INTRAVENOUS

## 2023-08-13 MED ORDER — LACTATED RINGERS IV SOLN
INTRAVENOUS | Status: DC
Start: 2023-08-13 — End: 2023-08-14

## 2023-08-13 MED ORDER — CHLORHEXIDINE GLUCONATE 0.12 % MT SOLN
15.0000 mL | Freq: Once | OROMUCOSAL | Status: AC
Start: 2023-08-13 — End: 2023-08-14
  Administered 2023-08-14: 15 mL via OROMUCOSAL

## 2023-08-13 MED ORDER — CELECOXIB 200 MG PO CAPS
400.0000 mg | ORAL_CAPSULE | Freq: Once | ORAL | Status: AC
  Administered 2023-08-14: 400 mg via ORAL

## 2023-08-13 MED ORDER — ORAL CARE MOUTH RINSE: 15.0000 mL | Freq: Once | OROMUCOSAL | Status: AC

## 2023-08-14 ENCOUNTER — Ambulatory Visit: Payer: Medicare Other | Admitting: Certified Registered"

## 2023-08-14 ENCOUNTER — Ambulatory Visit: Payer: Medicare Other | Admitting: Urgent Care

## 2023-08-14 ENCOUNTER — Ambulatory Visit
Admission: RE | Admit: 2023-08-14 | Discharge: 2023-08-14 | Disposition: A | Discharge status: Home / Self Care | Payer: Medicare Other | Attending: Orthopedic Surgery | Admitting: Orthopedic Surgery

## 2023-08-14 ENCOUNTER — Encounter
Admission: RE | Disposition: A | Discharge status: Home / Self Care | Payer: Self-pay | Source: Home / Self Care | Attending: Orthopedic Surgery

## 2023-08-14 ENCOUNTER — Other Ambulatory Visit: Payer: Self-pay

## 2023-08-14 ENCOUNTER — Encounter: Payer: Self-pay | Admitting: Orthopedic Surgery

## 2023-08-14 DIAGNOSIS — Z87891 Personal history of nicotine dependence: Secondary | ICD-10-CM | POA: Insufficient documentation

## 2023-08-14 DIAGNOSIS — J45909 Unspecified asthma, uncomplicated: Secondary | ICD-10-CM | POA: Diagnosis not present

## 2023-08-14 DIAGNOSIS — I10 Essential (primary) hypertension: Secondary | ICD-10-CM | POA: Diagnosis not present

## 2023-08-14 DIAGNOSIS — G5603 Carpal tunnel syndrome, bilateral upper limbs: Secondary | ICD-10-CM | POA: Diagnosis present

## 2023-08-14 DIAGNOSIS — Z981 Arthrodesis status: Secondary | ICD-10-CM | POA: Diagnosis not present

## 2023-08-14 DIAGNOSIS — Z79890 Hormone replacement therapy (postmenopausal): Secondary | ICD-10-CM | POA: Insufficient documentation

## 2023-08-14 DIAGNOSIS — Z9889 Other specified postprocedural states: Secondary | ICD-10-CM

## 2023-08-14 HISTORY — PX: CARPAL TUNNEL RELEASE: SHX101

## 2023-08-14 SURGERY — CARPAL TUNNEL RELEASE
Anesthesia: General | Site: Wrist | Laterality: Left

## 2023-08-14 MED ORDER — DEXAMETHASONE SODIUM PHOSPHATE 10 MG/ML IJ SOLN
INTRAMUSCULAR | Status: AC
  Filled 2023-08-14: qty 1

## 2023-08-14 MED ORDER — LACTATED RINGERS IV SOLN: INTRAVENOUS | Status: AC

## 2023-08-14 MED ORDER — CEFAZOLIN SODIUM-DEXTROSE 2-4 GM/100ML-% IV SOLN
INTRAVENOUS | Status: AC
  Filled 2023-08-14: qty 100

## 2023-08-14 MED ORDER — ONDANSETRON HCL 4 MG/2ML IJ SOLN
INTRAMUSCULAR | Status: AC
  Filled 2023-08-14: qty 2

## 2023-08-14 MED ORDER — CELECOXIB 200 MG PO CAPS
ORAL_CAPSULE | ORAL | Status: AC
  Filled 2023-08-14: qty 2

## 2023-08-14 MED ORDER — CHLORHEXIDINE GLUCONATE 0.12 % MT SOLN
OROMUCOSAL | Status: AC
Start: 2023-08-14 — End: ?
  Filled 2023-08-14: qty 15

## 2023-08-14 MED ORDER — ONDANSETRON HCL 4 MG/2ML IJ SOLN
INTRAMUSCULAR | Status: DC | PRN
  Administered 2023-08-14 (×2): 4 mg via INTRAVENOUS

## 2023-08-14 MED ORDER — FENTANYL CITRATE (PF) 100 MCG/2ML IJ SOLN: 25.0000 ug | INTRAMUSCULAR | Status: DC | PRN

## 2023-08-14 MED ORDER — MIDAZOLAM HCL 2 MG/2ML IJ SOLN
INTRAMUSCULAR | Status: DC | PRN
  Administered 2023-08-14: 2 mg via INTRAVENOUS

## 2023-08-14 MED ORDER — ONDANSETRON HCL 4 MG/2ML IJ SOLN
4.0000 mg | Freq: Once | INTRAMUSCULAR | Status: DC | PRN
Start: 2023-08-14 — End: 2023-08-14

## 2023-08-14 MED ORDER — 0.9 % SODIUM CHLORIDE (POUR BTL) OPTIME
TOPICAL | Status: DC | PRN
  Administered 2023-08-14: 500 mL

## 2023-08-14 MED ORDER — BUPIVACAINE HCL (PF) 0.25 % IJ SOLN
INTRAMUSCULAR | Status: AC
  Filled 2023-08-14: qty 30

## 2023-08-14 MED ORDER — EPHEDRINE SULFATE-NACL 50-0.9 MG/10ML-% IV SOSY
PREFILLED_SYRINGE | INTRAVENOUS | Status: DC | PRN
  Administered 2023-08-14: 10 mg via INTRAVENOUS

## 2023-08-14 MED ORDER — MIDAZOLAM HCL 2 MG/2ML IJ SOLN
INTRAMUSCULAR | Status: AC
Start: 2023-08-14 — End: ?
  Filled 2023-08-14: qty 2

## 2023-08-14 MED ORDER — OXYCODONE HCL 5 MG PO TABS: 5.0000 mg | ORAL_TABLET | Freq: Once | ORAL | Status: DC | PRN

## 2023-08-14 MED ORDER — ACETAMINOPHEN 10 MG/ML IV SOLN
INTRAVENOUS | Status: DC | PRN
  Administered 2023-08-14: 1000 mg via INTRAVENOUS

## 2023-08-14 MED ORDER — LIDOCAINE HCL (PF) 2 % IJ SOLN
INTRAMUSCULAR | Status: AC
  Filled 2023-08-14: qty 5

## 2023-08-14 MED ORDER — EPHEDRINE 5 MG/ML INJ
INTRAVENOUS | Status: AC
Start: 2023-08-14 — End: ?
  Filled 2023-08-14: qty 5

## 2023-08-14 MED ORDER — DEXAMETHASONE SODIUM PHOSPHATE 10 MG/ML IJ SOLN
INTRAMUSCULAR | Status: DC | PRN
  Administered 2023-08-14: 5 mg via INTRAVENOUS

## 2023-08-14 MED ORDER — ACETAMINOPHEN 10 MG/ML IV SOLN: 1000.0000 mg | Freq: Once | INTRAVENOUS | Status: DC | PRN

## 2023-08-14 MED ORDER — PHENYLEPHRINE 80 MCG/ML (10ML) SYRINGE FOR IV PUSH (FOR BLOOD PRESSURE SUPPORT)
PREFILLED_SYRINGE | INTRAVENOUS | Status: DC | PRN
  Administered 2023-08-14: 160 ug via INTRAVENOUS
  Administered 2023-08-14 (×2): 80 ug via INTRAVENOUS

## 2023-08-14 MED ORDER — PROPOFOL 10 MG/ML IV BOLUS
INTRAVENOUS | Status: AC
  Filled 2023-08-14: qty 40

## 2023-08-14 MED ORDER — PHENYLEPHRINE 80 MCG/ML (10ML) SYRINGE FOR IV PUSH (FOR BLOOD PRESSURE SUPPORT)
PREFILLED_SYRINGE | INTRAVENOUS | Status: AC
  Filled 2023-08-14: qty 10

## 2023-08-14 MED ORDER — ACETAMINOPHEN 10 MG/ML IV SOLN
INTRAVENOUS | Status: AC
  Filled 2023-08-14: qty 100

## 2023-08-14 MED ORDER — PROPOFOL 10 MG/ML IV BOLUS
INTRAVENOUS | Status: DC | PRN
  Administered 2023-08-14: 150 mg via INTRAVENOUS

## 2023-08-14 MED ORDER — OXYCODONE HCL 5 MG/5ML PO SOLN: 5.0000 mg | Freq: Once | ORAL | Status: DC | PRN

## 2023-08-14 MED ORDER — FENTANYL CITRATE (PF) 100 MCG/2ML IJ SOLN
INTRAMUSCULAR | Status: AC
  Filled 2023-08-14: qty 2

## 2023-08-14 MED ORDER — FENTANYL CITRATE (PF) 100 MCG/2ML IJ SOLN
INTRAMUSCULAR | Status: DC | PRN
  Administered 2023-08-14: 50 ug via INTRAVENOUS

## 2023-08-14 MED ORDER — BUPIVACAINE HCL (PF) 0.25 % IJ SOLN
INTRAMUSCULAR | Status: DC | PRN
  Administered 2023-08-14: 8 mL

## 2023-08-14 MED ORDER — LIDOCAINE HCL (CARDIAC) PF 100 MG/5ML IV SOSY
PREFILLED_SYRINGE | INTRAVENOUS | Status: DC | PRN
  Administered 2023-08-14: 100 mg via INTRAVENOUS

## 2023-08-14 SURGICAL SUPPLY — 26 items
BNDG ELASTIC 3X5.8 VLCR NS LF (GAUZE/BANDAGES/DRESSINGS) ×1 IMPLANT
BNDG ESMARCH 4X12 STRL LF (GAUZE/BANDAGES/DRESSINGS) ×1 IMPLANT
CUFF TOURN SGL QUICK 18X4 (TOURNIQUET CUFF) ×1 IMPLANT
DRAPE U-SHAPE 47X51 STRL (DRAPES) ×1 IMPLANT
DURAPREP 26ML APPLICATOR (WOUND CARE) ×1 IMPLANT
ELECT CAUTERY BLADE 6.4 (BLADE) ×1 IMPLANT
ELECT REM PT RETURN 9FT ADLT (ELECTROSURGICAL) ×1
ELECTRODE REM PT RTRN 9FT ADLT (ELECTROSURGICAL) ×1 IMPLANT
GAUZE SPONGE 4X4 12PLY STRL (GAUZE/BANDAGES/DRESSINGS) ×1 IMPLANT
GAUZE XEROFORM 1X8 LF (GAUZE/BANDAGES/DRESSINGS) ×1 IMPLANT
GLOVE BIOGEL M STRL SZ7.5 (GLOVE) ×1 IMPLANT
GLOVE SURG UNDER POLY LF SZ8 (GLOVE) ×2 IMPLANT
GOWN STRL REUS W/ TWL LRG LVL3 (GOWN DISPOSABLE) ×2 IMPLANT
KIT TURNOVER KIT A (KITS) ×1 IMPLANT
MANIFOLD NEPTUNE II (INSTRUMENTS) ×1 IMPLANT
NS IRRIG 500ML POUR BTL (IV SOLUTION) ×1 IMPLANT
PACK EXTREMITY ARMC (MISCELLANEOUS) ×1 IMPLANT
PAD CAST 3X4 CTTN HI CHSV (CAST SUPPLIES) ×1 IMPLANT
SOL PREP PVP 2OZ (MISCELLANEOUS) ×1
SOLUTION PREP PVP 2OZ (MISCELLANEOUS) ×1 IMPLANT
SPLINT CAST 1 STEP 3X12 (MISCELLANEOUS) ×1 IMPLANT
STOCKINETTE 48X4 2 PLY STRL (GAUZE/BANDAGES/DRESSINGS) ×1 IMPLANT
STOCKINETTE STRL 4IN 9604848 (GAUZE/BANDAGES/DRESSINGS) ×1 IMPLANT
SUT ETHILON 5-0 FS-2 18 BLK (SUTURE) ×1 IMPLANT
TRAP FLUID SMOKE EVACUATOR (MISCELLANEOUS) ×1 IMPLANT
WATER STERILE IRR 500ML POUR (IV SOLUTION) ×1 IMPLANT

## 2023-08-14 NOTE — Anesthesia Postprocedure Evaluation (Signed)
Anesthesia Post Note  Patient: Monica Tyler  Procedure(s) Performed: CARPAL TUNNEL RELEASE (Left: Wrist)  Patient location during evaluation: PACU Anesthesia Type: General Level of consciousness: awake and alert, oriented and patient cooperative Pain management: pain level controlled Vital Signs Assessment: post-procedure vital signs reviewed and stable Respiratory status: spontaneous breathing, nonlabored ventilation and respiratory function stable Cardiovascular status: blood pressure returned to baseline and stable Postop Assessment: adequate PO intake Anesthetic complications: no   No notable events documented.   Last Vitals:  Vitals:   08/14/23 1715 08/14/23 1727  BP: 137/78 (!) 141/72  Pulse: 76 71  Resp: 18 16  Temp: (!) 36.3 C (!) 36.4 C  SpO2: 97% 92%    Last Pain:  Vitals:   08/14/23 1727  TempSrc: Temporal  PainSc: 1                  Reed Breech

## 2023-08-14 NOTE — Transfer of Care (Signed)
Immediate Anesthesia Transfer of Care Note  Patient: Monica Tyler  Procedure(s) Performed: CARPAL TUNNEL RELEASE (Left: Wrist)  Patient Location: PACU  Anesthesia Type:General  Level of Consciousness: awake, drowsy, and patient cooperative  Airway & Oxygen Therapy: Patient Spontanous Breathing and Patient connected to face mask oxygen  Post-op Assessment: Report given to RN and Post -op Vital signs reviewed and stable  Post vital signs: Reviewed and stable  Last Vitals:  Vitals Value Taken Time  BP 133/71 08/14/23 1637  Temp 36.4 C 08/14/23 1637  Pulse 72 08/14/23 1639  Resp 16 08/14/23 1639  SpO2 100 % 08/14/23 1639  Vitals shown include unfiled device data.  Last Pain:  Vitals:   08/14/23 1422  TempSrc: Temporal  PainSc: 7          Complications: No notable events documented.

## 2023-08-14 NOTE — Anesthesia Preprocedure Evaluation (Addendum)
Anesthesia Evaluation  Patient identified by MRN, date of birth, ID band Patient awake    Reviewed: Allergy & Precautions, NPO status , Patient's Chart, lab work & pertinent test results  History of Anesthesia Complications Negative for: history of anesthetic complications  Airway Mallampati: III   Neck ROM: Full    Dental  (+) Missing, Chipped   Pulmonary asthma , former smoker (quit 1970s)   Pulmonary exam normal breath sounds clear to auscultation       Cardiovascular hypertension, Normal cardiovascular exam Rhythm:Regular Rate:Normal  ECG 08/09/23: normal   Neuro/Psych negative neurological ROS     GI/Hepatic negative GI ROS,,,  Endo/Other  Hypothyroidism  Obesity; prediabetes  Renal/GU Renal disease (nephrolithiasis)     Musculoskeletal  (+) Arthritis ,    Abdominal   Peds  Hematology negative hematology ROS (+)   Anesthesia Other Findings   Reproductive/Obstetrics Cervical CA                             Anesthesia Physical Anesthesia Plan  ASA: 2  Anesthesia Plan: General   Post-op Pain Management:    Induction: Intravenous  PONV Risk Score and Plan: 3 and Ondansetron, Dexamethasone and Treatment may vary due to age or medical condition  Airway Management Planned: LMA  Additional Equipment:   Intra-op Plan:   Post-operative Plan: Extubation in OR  Informed Consent: I have reviewed the patients History and Physical, chart, labs and discussed the procedure including the risks, benefits and alternatives for the proposed anesthesia with the patient or authorized representative who has indicated his/her understanding and acceptance.     Dental advisory given  Plan Discussed with: CRNA  Anesthesia Plan Comments: (Patient consented for risks of anesthesia including but not limited to:  - adverse reactions to medications - damage to eyes, teeth, lips or other oral  mucosa - nerve damage due to positioning  - sore throat or hoarseness - damage to heart, brain, nerves, lungs, other parts of body or loss of life  Informed patient about role of CRNA in peri- and intra-operative care.  Patient voiced understanding.)        Anesthesia Quick Evaluation

## 2023-08-14 NOTE — Op Note (Signed)
OPERATIVE NOTE  DATE OF SURGERY:  08/14/2023  PATIENT NAME:  Monica Tyler   DOB: 04/14/1956  MRN: [id number]  PRE-OPERATIVE DIAGNOSIS: Left carpal tunnel syndrome  POST-OPERATIVE DIAGNOSIS:  Same  PROCEDURE:  Left carpal tunnel release  SURGEON:  Jena Gauss. M.D.  ANESTHESIA: general  ESTIMATED BLOOD LOSS: Minimal  FLUIDS REPLACED: 100 mL of crystalloid  TOURNIQUET TIME: 27 minutes  DRAINS: None  INDICATIONS FOR SURGERY: Monica Tyler is a 67 y.o. year old female with a long history of numbness and paresthesias to the left hand. EMG/nerve conduction studies demonstrated findings consistent with carpal tunnel syndrome.The patient had not seen any significant improvement despite conservative nonsurgical intervention. After discussion of the risks and benefits of surgical intervention, the patient expressed understanding of the risks benefits and agree with plans for carpal tunnel release.   PROCEDURE IN DETAIL: The patient was brought into the operating room and after adequate general anesthesia, a tourniquet was placed on the patient's left upper arm.The left hand and arm were prepped with alcohol and ChloraPrep and draped in the usual sterile fashion. A "time-out" was performed as per usual protocol. The hand and forearm were exsanguinated using an Esmarch and the tourniquet was inflated to 250 mmHg. Loupe magnification was used throughout the procedure. An incision was made just ulnar to the thenar palmar crease. Dissection was carried down through the palmar fascia to the transverse carpal ligament. The transverse carpal ligament was sharply incised, taking care to protect the underlying structures with the carpal tunnel. Complete release of the transverse carpal ligament was achieved. There was no evidence of ganglion cyst or lipoma within the carpal tunnel. The wound was irrigated with copious amounts of normal saline with antibiotic solution. The skin was then  re-approximated with interrupted sutures of #5-0 nylon. A sterile dressing was applied followed by application of a volar splint. The tourniquet was deflated with a total tourniquet time of 27 minutes.  The patient tolerated the procedure well and was transported to the PACU in stable condition.  Anis Cinelli P. Angie Fava., M.D.

## 2023-08-14 NOTE — Anesthesia Procedure Notes (Signed)
Procedure Name: LMA Insertion Date/Time: 08/14/2023 3:42 PM  Performed by: Morene Crocker, CRNAPre-anesthesia Checklist: Patient identified, Patient being monitored, Timeout performed, Emergency Drugs available and Suction available Patient Re-evaluated:Patient Re-evaluated prior to induction Oxygen Delivery Method: Circle system utilized Preoxygenation: Pre-oxygenation with 100% oxygen Induction Type: IV induction Ventilation: Mask ventilation without difficulty LMA: LMA inserted LMA Size: 4.0 Tube type: Oral Number of attempts: 1 Placement Confirmation: positive ETCO2 and breath sounds checked- equal and bilateral Tube secured with: Tape Dental Injury: Teeth and Oropharynx as per pre-operative assessment  Comments: Smooth atraumatic placement of LMA, no complications noted

## 2023-08-14 NOTE — Interval H&P Note (Signed)
History and Physical Interval Note:  08/14/2023 3:09 PM  Monica Tyler  has presented today for surgery, with the diagnosis of CARPAL TUNNEL SYNDROME, LEFT.  The various methods of treatment have been discussed with the patient and family. After consideration of risks, benefits and other options for treatment, the patient has consented to  Procedure(s): CARPAL TUNNEL RELEASE (Left) as a surgical intervention.  The patient's history has been reviewed, patient examined, no change in status, stable for surgery.  I have reviewed the patient's chart and labs.  Questions were answered to the patient's satisfaction.     Magic Mohler P Halah Whiteside

## 2023-08-15 ENCOUNTER — Encounter: Payer: Self-pay | Admitting: Orthopedic Surgery

## 2023-10-19 NOTE — Discharge Instructions (Signed)
Instructions after Hand / Wrist Surgery   Monica Tyler. Monica Fava., M.D.    Dept. of Orthopaedics & Sports Medicine [facility] [address] [place], [place]  [address]  Phone: [phone]   Fax: [phone]       [email or web address]       DIET: Drink plenty of non-alcoholic fluids & begin a light diet. Resume your normal diet the day after surgery.  ACTIVITY:  Keep the hand elevated above the level of the elbow. Begin gently moving the fingers on a regular basis to avoid stiffness. Avoid any heavy lifting, pushing, or pulling with the operative hand. Do not drive or operate any equipment until instructed.  WOUND CARE:  Keep the splint/bandage clean and dry.  The splint and stitches will be removed in the office. Continue to use the ice packs periodically to reduce pain and swelling. You may bathe or shower after the stitches are removed at the first office visit following surgery.  MEDICATIONS: You may resume your regular medications. Please take the pain medication as prescribed. Do not take pain medication on an empty stomach. Do not drive or drink alcoholic beverages when taking pain medications.  CALL THE OFFICE FOR: Temperature above 101 degrees Excessive bleeding or drainage on the dressing. Excessive swelling, coldness, or paleness of the fingers. Persistent nausea and vomiting.  FOLLOW-UP:  You should have an appointment to return to the office in 7-10 days after surgery.   REMEMBER: R.I.C.E. = Rest, Ice, Compression, Elevation !     [facility] Department Directory         [email or web address]       [email or web address]          Cardiology  Appointments: [place] - [phone]  Endocrinology  Appointments: [place] [phone] [place] - [phone]  Gastroenterology  Appointments: [place] [phone] [place] - [phone]        General  Surgery   Appointments: [facility]  Internal Medicine/Family Medicine  Appointments: [facility] - [phone] [place] - [phone]  Metabolic and Weigh Loss Surgery  Appointments: [facility]        Neurology  Appointments: [place] [phone] [place] - [phone]  Neurosurgery  Appointments: [place]  Obstetrics & Gynecology  Appointments: [place] [phone] [place] - [phone]        Pediatrics  Appointments: [facility] [phone] [place] - [phone]  Physiatry  Appointments: [place] [phone]  Physical Therapy  Appointments: [place] - [phone]        Podiatry  Appointments: [place] [phone] [place] - [phone]  Pulmonology  Appointments: [place]  Rheumatology  Appointments: [place] [phone]        [place] Location: [facility]  [address] [place], [place]  [address]  [place] Location: [facility] [address] [place], [place]  [address]  [place] Location: [facility] [address] [place], [place]  [address]

## 2023-10-22 ENCOUNTER — Encounter
Admission: RE | Admit: 2023-10-22 | Discharge: 2023-10-22 | Disposition: A | Discharge status: Home / Self Care | Payer: Medicare Other | Source: Ambulatory Visit | Attending: Orthopedic Surgery | Admitting: Orthopedic Surgery

## 2023-10-22 ENCOUNTER — Other Ambulatory Visit: Payer: Self-pay

## 2023-10-22 NOTE — Patient Instructions (Addendum)
Your procedure is scheduled on: Monday 10/28/23 Report to the Registration Desk on the 1st floor of the [facility]. To find out your arrival time, please call [phone] between 1PM - 3PM on: Friday 10/25/23 If your arrival time is 6:00 am, do not arrive before that time as the [facility] entrance doors do not open until 6:00 am.   REMEMBER: Instructions that are not followed completely may result in serious medical risk, up to and including death; or upon the discretion of your surgeon and anesthesiologist your surgery may need to be rescheduled.   Do not eat food after midnight the night before surgery.  No gum chewing or hard candies.   You may however, drink CLEAR liquids up to 2 hours before you are scheduled to arrive for your surgery. Do not drink anything within 2 hours of your scheduled arrival time.   Clear liquids include: - water  - apple juice without pulp - gatorade (not RED colors) - black coffee or tea (Do NOT add milk or creamers to the coffee or tea) Do NOT drink anything that is not on this list.   In addition, your doctor has ordered for you to drink the provided:  Ensure Pre-Surgery Clear Carbohydrate Drink  Drinking this carbohydrate drink up to two hours before surgery helps to reduce insulin resistance and improve patient outcomes. Please complete drinking 2 hours before scheduled arrival time.   One week prior to surgery: starting today, November 15 Stop meloxicam and Anti-inflammatories (NSAIDS) such as Advil, Aleve, Ibuprofen, Motrin, Naproxen, Naprosyn and Aspirin based products such as Excedrin, Goody's Powder, BC Powder. Stop ANY OVER THE COUNTER supplements until after surgery. Stop multiple vitamin, calcium.   You may however, continue to take Tylenol if needed for pain up until the day of surgery.   Phentermine - hold for 7 days before surgery. Resume AFTER surgery.   Continue taking all of your other prescription medications up until the day of  surgery.   ON THE DAY OF SURGERY ONLY TAKE THESE MEDICATIONS WITH SIPS OF WATER:   Amlodipine Gabapentin Levothyroxine Nexium Clonidine Robaxin if needed Hydrocodone if needed for pain   No Alcohol for 24 hours before or after surgery.   No Smoking including e-cigarettes for 24 hours before surgery.  No chewable tobacco products for at least 6 hours before surgery.  No nicotine patches on the day of surgery.   Do not use any "recreational" drugs for at least a week (preferably 2 weeks) before your surgery.  Please be advised that the combination of cocaine and anesthesia may have negative outcomes, up to and including death. If you test positive for cocaine, your surgery will be cancelled.   On the morning of surgery brush your teeth with toothpaste and water, you may rinse your mouth with mouthwash if you wish. Do not swallow any toothpaste or mouthwash.   Use CHG Soap as directed on instruction sheet.   Do not wear jewelry, make-up, hairpins, clips or nail polish.   For welded (permanent) jewelry: bracelets, anklets, waist bands, etc.  Please have this removed prior to surgery.  If it is not removed, there is a chance that hospital personnel will need to cut it off on the day of surgery.   Do not wear lotions, powders, or perfumes.    Do not shave body hair from the neck down 48 hours before surgery.   Contact lenses, hearing aids and dentures may not be worn into surgery.  Do not bring valuables to the hospital. [facility] is not responsible for any missing/lost belongings or valuables.    Notify your doctor if there is any change in your medical condition (cold, fever, infection).   Wear comfortable clothing (specific to your surgery type) to the hospital.   After surgery, you can help prevent lung complications by doing breathing exercises.  Take deep breaths and cough every 1-2 hours.    If you are being discharged the day of surgery, you will not be allowed to  drive home. You will need a responsible individual to drive you home and stay with you for 24 hours after surgery.    If you are taking public transportation, you will need to have a responsible individual with you.   Please call the Pre-admissions Testing Dept. at [phone] if you have any questions about these instructions.   Surgery Visitation Policy:   Patients having surgery or a procedure may have two visitors.  Children under the age of 46 must have an adult with them who is not the patient.          Preparing for Surgery with CHLORHEXIDINE GLUCONATE (CHG) Soap   Chlorhexidine Gluconate (CHG) Soap   o An antiseptic cleaner that kills germs and bonds with the skin to continue killing germs even after washing   o Used for showering the night before surgery and morning of surgery   Before surgery, you can play an important role by reducing the number of germs on your skin.  CHG (Chlorhexidine gluconate) soap is an antiseptic cleanser which kills germs and bonds with the skin to continue killing germs even after washing.   Please do not use if you have an allergy to CHG or antibacterial soaps. If your skin becomes reddened/irritated stop using the CHG.   1. Shower the NIGHT BEFORE SURGERY and the MORNING OF SURGERY with CHG soap.   2. If you choose to wash your hair, wash your hair first as usual with your normal shampoo.   3. After shampooing, rinse your hair and body thoroughly to remove the shampoo.   4. Use CHG as you would any other liquid soap. You can apply CHG directly to the skin and wash gently with a scrungie or a clean washcloth.   5. Apply the CHG soap to your body only from the neck down. Do not use on open wounds or open sores. Avoid contact with your eyes, ears, mouth, and genitals (private parts). Wash face and genitals (private parts) with your normal soap.   6. Wash thoroughly, paying special attention to the area where your surgery will be  performed.   7. Thoroughly rinse your body with warm water.   8. Do not shower/wash with your normal soap after using and rinsing off the CHG soap.   9. Pat yourself dry with a clean towel.   10. Wear clean pajamas to bed the night before surgery.   12. Place clean sheets on your bed the night of your first shower and do not sleep with pets.   13. Shower again with the CHG soap on the day of surgery prior to arriving at the hospital.   14. Do not apply any deodorants/lotions/powders.   15. Please wear clean clothes to the hospital.

## 2023-10-27 ENCOUNTER — Encounter: Payer: Self-pay | Admitting: Orthopedic Surgery

## 2023-10-27 MED ORDER — LACTATED RINGERS IV SOLN: INTRAVENOUS | Status: DC

## 2023-10-27 MED ORDER — CHLORHEXIDINE GLUCONATE 0.12 % MT SOLN: 15.0000 mL | Freq: Once | OROMUCOSAL | Status: AC

## 2023-10-27 MED ORDER — CEFAZOLIN SODIUM-DEXTROSE 2-4 GM/100ML-% IV SOLN: 2.0000 g | INTRAVENOUS | Status: DC

## 2023-10-27 MED ORDER — ORAL CARE MOUTH RINSE
15.0000 mL | Freq: Once | OROMUCOSAL | Status: AC
  Administered 2023-10-28: 15 mL via OROMUCOSAL

## 2023-10-27 MED ORDER — CELECOXIB 200 MG PO CAPS
400.0000 mg | ORAL_CAPSULE | Freq: Once | ORAL | Status: AC
  Administered 2023-10-28: 400 mg via ORAL

## 2023-10-27 NOTE — H&P (Signed)
ORTHOPAEDIC HISTORY & PHYSICAL Monica Tyler, Adelina Mings., MD - 10/03/2023 2:15 PM EST Formatting of this note is different from the original. Images from the original note were not included. Chief Complaint: Chief Complaint Patient presents with Post Operative Visit 6 weeks left carpal tunnel release 08/14/23  Reason for Visit: The patient is a 68 y.o. female who returns today with her husband for reevaluation of both hands. She is 6 weeks status post left carpal tunnel release. She denies any problems with the surgical incision. She reports resolution of the left hand numbness and tingling.  She reports continued numbness and tingling to the right hand. The paresthesias awaken her at night. She has not appreciated any improvement with wrist splint.  Medications: Current Outpatient Medications Medication Sig Dispense Refill amLODIPine (NORVASC) 5 MG tablet Take 1 tablet (5 mg total) by mouth once daily 90 tablet 1 calcium carbonate-vitamin D3 (OS-CAL 500+D) 500 mg(1,250mg ) -400 unit tablet Take 1 tablet by mouth once daily docusate (COLACE) 250 MG capsule Take 250 mg by mouth once daily as needed for Constipation esomeprazole (NEXIUM) 40 MG DR capsule TAKE 1 CAPSULE(40 MG) BY MOUTH DAILY 90 capsule 1 FUROsemide (LASIX) 20 MG tablet 3 days a week. Monday, Wed, and Friday. 30 tablet 5 gabapentin (NEURONTIN) 300 MG capsule Take 600 mg by mouth nightly levothyroxine (SYNTHROID) 50 MCG tablet TAKE 1 TABLET(50 MCG) BY MOUTH DAILY 30 TO 60 MINUTES BEFORE BREAKFAST ON AN EMPTY STOMACH AND WITH A GLASS OF WATER 30 tablet 11 meloxicam (MOBIC) 15 MG tablet Take 15 mg by mouth once daily methocarbamoL (ROBAXIN) 500 MG tablet multivitamin with minerals tablet Take 1 tablet by mouth once daily phentermine (ADIPEX-P) 37.5 mg tablet Take 1 tablet (37.5 mg total) by mouth every morning before breakfast 30 tablet 3 sennosides-docusate (SENOKOT-S) 8.6-50 mg tablet Take 1 tablet by mouth 2 (two) times daily  60 tablet 11 topiramate (TOPAMAX) 50 MG tablet Take 1 tablet (50 mg total) by mouth at bedtime 30 tablet 5 cloNIDine HCL (CATAPRES) 0.2 MG tablet Take 1 tablet (0.2 mg total) by mouth 2 (two) times daily 60 tablet 11  No current facility-administered medications for this visit.  Allergies: Allergies Allergen Reactions Penicillins Swelling and Rash Did it involve swelling of the face/tongue/throat, SOB, or low BP? Unknown Did it involve sudden or severe rash/hives, skin peeling, or any reaction on the inside of your mouth or nose? Unknown Did you need to seek medical attention at a hospital or doctor's office? No When did it last happen? childhood reaction If all above answers are "NO", may proceed with cephalosporin use. Did it involve swelling of the face/tongue/throat, SOB, or low BP? Unknown Did it involve sudden or severe rash/hives, Codeine Nausea And Vomiting  Past Medical History: Past Medical History: Diagnosis Date Arthritis Asthma, unspecified asthma severity, unspecified whether complicated, unspecified whether persistent (HHS-HCC) Chicken pox DDD (degenerative disc disease), lumbar History of cervical cancer Hypertension  Past Surgical History: Past Surgical History: Procedure Laterality Date Right total knee arthroplasty 12/21/2011 Dr. Magnus Ivan Left total knee arthroplasty using computer-assisted navigation 06/15/2019 Dr Ernest Pine C4-5, C5-6, and C6-7 ACDF 01/10/2022 Dr. Sharolyn Douglas Left carpal tunnel release 08/14/2023 Dr Ernest Pine BACK SURGERY OTHER SURGERY cryosurgery for cervix-WS SKIN GRAFT FROM RIGHT GROIN TO SCALP TUBAL LIGATION  Social History: Social History  Socioeconomic History Marital status: Married Spouse name: Molly Maduro Number of children: 2 Years of education: 12 Highest education level: High school graduate Occupational History Occupation: Retired- [employer] Tobacco Use Smoking status: Former Current packs/day:  0.00 Types:  Cigarettes Quit date: 73 Years since quitting: 33.0 Smokeless tobacco: Never Vaping Use Vaping status: Never Used Substance and Sexual Activity Alcohol use: Never Drug use: Never Sexual activity: Yes Partners: Male  Family History: Family History Problem Relation Name Age of Onset Breast cancer Mother Diabetes type II Mother Dementia Mother Breast cancer Sister Stroke Father Diabetes type II Brother  Review of Systems: A comprehensive 14 point ROS was performed, reviewed, and the pertinent orthopaedic findings are documented in the HPI.  Exam BP (!) 146/84  Ht 162.6 cm (5\' 4" )  Wt (!) 102.5 kg (226 lb)  BMI 38.79 kg/m  General: Well-developed, well-nourished female seen in no acute distress. Even heel to toe gait.  HEENT: Atraumatic, normocephalic. Pupils are equal and reactive to light. Extraocular motion is intact. Sclera are clear. Oropharynx is clear with moist mucosa.  Neck: Good range of motion. No tenderness to palpation. Spurling`s test is negative.  Lungs: Clear to auscultation bilaterally.  Cardiovascular: Regular rate and rhythm. Normal S1, S2. No murmur . No appreciable gallops or rubs. Peripheral pulses are palpable.  Extremities: Normal shoulder contour. Good range of motion and stability of the shoulders, elbows, and wrists. Tinel`s test at the elbow is negative.  Right hand: Tenderness: Negative Erythema: Negative Swelling: Negative Capillary Refill: Normal Thenar atrophy: negative Intrinsic wasting: negative Grip strength: fair to good grip strength Pincer strength: fair to good pincer strength Tinel`s test: positive Phalen`s test: positive Triggering: No gross triggering or locking of the digits Finkelstein`s test: Negative Range of motion: Good range of motion of the digits  Left hand: Tenderness: Negative Erythema: Negative Swelling: Negative Capillary Refill: Normal Thenar atrophy: equivocal Intrinsic wasting:  negative Grip strength: fair to good grip strength Pincer strength: fair to good pincer strength Tinel`s test: negative Phalen`s test: negative Triggering: No gross triggering or locking of the digits Finkelstein`s test: Negative Range of motion: Good range of motion of the digits Surgical incision is healing well.  Vascular: Peripheral pulses are palpable. Good capillary refill. No gross pretibial or ankle edema. Homans test is negative.  Neurologic: Awake, alert , and oriented. Sensory function is intact except for decreased discrimination to pinprick and light touch in a right median nerve distribution. Motor strength is judged to be 5/5 except as noted above. No clonus or tremor. Motor coordination is within normal limits.  Nerve conduction studies: EMG/nerve conduction studies performed by Dr. Cristopher Peru on 09/04/2022 were reviewed. There is electrical evidence of median sensorimotor peripheral neuropathy characteristic of bilateral carpal tunnel syndrome superimposed on general sensory peripheral neuropathy.  Impression: Left carpal tunnel release Right carpal tunnel syndrome  Plan: The findings were discussed in detail with the patient. She is doing extremely well with the left hand. We discussed manual massage and desensitization to the surgical scar.  Conservative treatment options for the right hand were reviewed with the patient. We discussed the risks and benefits of surgical intervention. We discussed the risk that carpal tunnel release would not necessarily address the general sensory peripheral neuropathy. The usual perioperative course was also discussed in detail. The patient expressed understanding of the risks and benefits of surgical intervention and would like to proceed with plans for right carpal tunnel release.  I spent a total of 45 minutes in both face-to-face and non-face-to-face activities for this visit on the date of this encounter.  SPECIAL  EQUIPMENT: None SPECIAL STUDIES: None ACTIVITIES: As tolerated. WORK STATUS: Not applicable. THERAPY: Home exercise program as reviewed with the patient.  MEDICATIONS: Requested Prescriptions  No prescriptions requested or ordered in this encounter  FOLLOW-UP: Return for preop History & Physical pending surgery date.  Janeka Libman P. Angie Fava., M.D.  This note was generated in part with voice recognition software and I apologize for any typographical errors that were not detected and corrected.  Electronically signed by Shari Heritage., MD at 10/06/2023 8:34 AM EST

## 2023-10-28 ENCOUNTER — Ambulatory Visit: Payer: Medicare Other | Admitting: Urgent Care

## 2023-10-28 ENCOUNTER — Other Ambulatory Visit: Payer: Self-pay

## 2023-10-28 ENCOUNTER — Ambulatory Visit: Payer: Medicare Other | Admitting: Anesthesiology

## 2023-10-28 ENCOUNTER — Encounter: Payer: Self-pay | Admitting: Orthopedic Surgery

## 2023-10-28 ENCOUNTER — Ambulatory Visit
Admission: RE | Admit: 2023-10-28 | Discharge: 2023-10-28 | Disposition: A | Discharge status: Home / Self Care | Payer: Medicare Other | Attending: Orthopedic Surgery | Admitting: Orthopedic Surgery

## 2023-10-28 ENCOUNTER — Encounter
Admission: RE | Disposition: A | Discharge status: Home / Self Care | Payer: Self-pay | Source: Home / Self Care | Attending: Orthopedic Surgery

## 2023-10-28 DIAGNOSIS — Z79890 Hormone replacement therapy (postmenopausal): Secondary | ICD-10-CM | POA: Insufficient documentation

## 2023-10-28 DIAGNOSIS — G5601 Carpal tunnel syndrome, right upper limb: Secondary | ICD-10-CM | POA: Diagnosis present

## 2023-10-28 DIAGNOSIS — Z791 Long term (current) use of non-steroidal anti-inflammatories (NSAID): Secondary | ICD-10-CM | POA: Diagnosis not present

## 2023-10-28 DIAGNOSIS — J45909 Unspecified asthma, uncomplicated: Secondary | ICD-10-CM | POA: Diagnosis not present

## 2023-10-28 DIAGNOSIS — Z9889 Other specified postprocedural states: Secondary | ICD-10-CM | POA: Insufficient documentation

## 2023-10-28 DIAGNOSIS — Z79899 Other long term (current) drug therapy: Secondary | ICD-10-CM | POA: Insufficient documentation

## 2023-10-28 DIAGNOSIS — K219 Gastro-esophageal reflux disease without esophagitis: Secondary | ICD-10-CM | POA: Diagnosis not present

## 2023-10-28 DIAGNOSIS — Z87891 Personal history of nicotine dependence: Secondary | ICD-10-CM | POA: Diagnosis not present

## 2023-10-28 DIAGNOSIS — I10 Essential (primary) hypertension: Secondary | ICD-10-CM | POA: Insufficient documentation

## 2023-10-28 DIAGNOSIS — E039 Hypothyroidism, unspecified: Secondary | ICD-10-CM | POA: Insufficient documentation

## 2023-10-28 HISTORY — PX: CARPAL TUNNEL RELEASE: SHX101

## 2023-10-28 SURGERY — CARPAL TUNNEL RELEASE
Anesthesia: General | Site: Wrist | Laterality: Right

## 2023-10-28 MED ORDER — OXYCODONE HCL 5 MG/5ML PO SOLN: 5.0000 mg | Freq: Once | ORAL | Status: DC | PRN

## 2023-10-28 MED ORDER — ONDANSETRON HCL 4 MG/2ML IJ SOLN
INTRAMUSCULAR | Status: AC
  Filled 2023-10-28: qty 2

## 2023-10-28 MED ORDER — ONDANSETRON HCL 4 MG/2ML IJ SOLN
INTRAMUSCULAR | Status: DC | PRN
  Administered 2023-10-28: 4 mg via INTRAVENOUS

## 2023-10-28 MED ORDER — PROPOFOL 1000 MG/100ML IV EMUL
INTRAVENOUS | Status: AC
  Filled 2023-10-28: qty 100

## 2023-10-28 MED ORDER — FENTANYL CITRATE (PF) 100 MCG/2ML IJ SOLN
INTRAMUSCULAR | Status: AC
  Filled 2023-10-28: qty 2

## 2023-10-28 MED ORDER — MIDAZOLAM HCL 2 MG/2ML IJ SOLN
INTRAMUSCULAR | Status: AC
Start: 2023-10-28 — End: ?
  Filled 2023-10-28: qty 2

## 2023-10-28 MED ORDER — ONDANSETRON HCL 4 MG PO TABS: 4.0000 mg | ORAL_TABLET | Freq: Four times a day (QID) | ORAL | Status: DC | PRN

## 2023-10-28 MED ORDER — DEXAMETHASONE SODIUM PHOSPHATE 10 MG/ML IJ SOLN
INTRAMUSCULAR | Status: DC | PRN
  Administered 2023-10-28: 10 mg via INTRAVENOUS

## 2023-10-28 MED ORDER — 0.9 % SODIUM CHLORIDE (POUR BTL) OPTIME
TOPICAL | Status: DC | PRN
  Administered 2023-10-28: 500 mL

## 2023-10-28 MED ORDER — ONDANSETRON HCL 4 MG/2ML IJ SOLN
4.0000 mg | Freq: Four times a day (QID) | INTRAMUSCULAR | Status: DC | PRN
Start: 2023-10-28 — End: 2023-10-28

## 2023-10-28 MED ORDER — SUCCINYLCHOLINE CHLORIDE 200 MG/10ML IV SOSY
PREFILLED_SYRINGE | INTRAVENOUS | Status: DC | PRN
  Administered 2023-10-28: 100 mg via INTRAVENOUS

## 2023-10-28 MED ORDER — HYDROCODONE-ACETAMINOPHEN 5-325 MG PO TABS: 1.0000 | ORAL_TABLET | ORAL | Status: DC | PRN

## 2023-10-28 MED ORDER — DEXAMETHASONE SODIUM PHOSPHATE 10 MG/ML IJ SOLN
INTRAMUSCULAR | Status: AC
  Filled 2023-10-28: qty 1

## 2023-10-28 MED ORDER — MIDAZOLAM HCL 2 MG/2ML IJ SOLN
INTRAMUSCULAR | Status: DC | PRN
  Administered 2023-10-28: 2 mg via INTRAVENOUS

## 2023-10-28 MED ORDER — ACETAMINOPHEN 325 MG PO TABS: 325.0000 mg | ORAL_TABLET | Freq: Four times a day (QID) | ORAL | Status: DC | PRN

## 2023-10-28 MED ORDER — METOCLOPRAMIDE HCL 10 MG PO TABS
5.0000 mg | ORAL_TABLET | Freq: Three times a day (TID) | ORAL | Status: DC | PRN
Start: 2023-10-28 — End: 2023-10-28

## 2023-10-28 MED ORDER — PHENYLEPHRINE HCL (PRESSORS) 10 MG/ML IV SOLN
INTRAVENOUS | Status: DC | PRN
  Administered 2023-10-28: 80 ug via INTRAVENOUS

## 2023-10-28 MED ORDER — MORPHINE SULFATE (PF) 2 MG/ML IV SOLN
0.5000 mg | INTRAVENOUS | Status: DC | PRN
Start: 2023-10-28 — End: 2023-10-28

## 2023-10-28 MED ORDER — HYDROCODONE-ACETAMINOPHEN 7.5-325 MG PO TABS
1.0000 | ORAL_TABLET | ORAL | Status: DC | PRN
  Filled 2023-10-28: qty 2

## 2023-10-28 MED ORDER — LIDOCAINE HCL (PF) 2 % IJ SOLN
INTRAMUSCULAR | Status: AC
  Filled 2023-10-28: qty 5

## 2023-10-28 MED ORDER — ACETAMINOPHEN 10 MG/ML IV SOLN
INTRAVENOUS | Status: AC
  Filled 2023-10-28: qty 100

## 2023-10-28 MED ORDER — FENTANYL CITRATE (PF) 100 MCG/2ML IJ SOLN
INTRAMUSCULAR | Status: DC | PRN
  Administered 2023-10-28 (×3): 25 ug via INTRAVENOUS

## 2023-10-28 MED ORDER — METOCLOPRAMIDE HCL 5 MG/ML IJ SOLN: 5.0000 mg | Freq: Three times a day (TID) | INTRAMUSCULAR | Status: DC | PRN

## 2023-10-28 MED ORDER — CEFAZOLIN SODIUM-DEXTROSE 2-4 GM/100ML-% IV SOLN
INTRAVENOUS | Status: AC
  Filled 2023-10-28: qty 100

## 2023-10-28 MED ORDER — FENTANYL CITRATE (PF) 100 MCG/2ML IJ SOLN: 25.0000 ug | INTRAMUSCULAR | Status: DC | PRN

## 2023-10-28 MED ORDER — BUPIVACAINE HCL (PF) 0.25 % IJ SOLN
INTRAMUSCULAR | Status: AC
  Filled 2023-10-28: qty 30

## 2023-10-28 MED ORDER — PROPOFOL 10 MG/ML IV BOLUS
INTRAVENOUS | Status: AC
  Filled 2023-10-28: qty 20

## 2023-10-28 MED ORDER — LIDOCAINE HCL (CARDIAC) PF 100 MG/5ML IV SOSY
PREFILLED_SYRINGE | INTRAVENOUS | Status: DC | PRN
  Administered 2023-10-28: 40 mg via INTRAVENOUS

## 2023-10-28 MED ORDER — CHLORHEXIDINE GLUCONATE 0.12 % MT SOLN
OROMUCOSAL | Status: AC
  Filled 2023-10-28: qty 15

## 2023-10-28 MED ORDER — OXYCODONE HCL 5 MG PO TABS: 5.0000 mg | ORAL_TABLET | Freq: Once | ORAL | Status: DC | PRN

## 2023-10-28 MED ORDER — ACETAMINOPHEN 10 MG/ML IV SOLN
INTRAVENOUS | Status: DC | PRN
  Administered 2023-10-28: 1000 mg via INTRAVENOUS

## 2023-10-28 MED ORDER — BUPIVACAINE HCL (PF) 0.25 % IJ SOLN
INTRAMUSCULAR | Status: DC | PRN
  Administered 2023-10-28: 10 mL

## 2023-10-28 MED ORDER — CELECOXIB 200 MG PO CAPS
ORAL_CAPSULE | ORAL | Status: AC
  Filled 2023-10-28: qty 2

## 2023-10-28 MED ORDER — PHENYLEPHRINE HCL-NACL 20-0.9 MG/250ML-% IV SOLN
INTRAVENOUS | Status: DC | PRN
  Administered 2023-10-28: 20 ug/min via INTRAVENOUS

## 2023-10-28 MED ORDER — PROPOFOL 10 MG/ML IV BOLUS
INTRAVENOUS | Status: DC | PRN
  Administered 2023-10-28: 200 mg via INTRAVENOUS

## 2023-10-28 SURGICAL SUPPLY — 26 items
BNDG ELASTIC 3X5.8 VLCR NS LF (GAUZE/BANDAGES/DRESSINGS) ×1 IMPLANT
BNDG ESMARCH 4X12 STRL LF (GAUZE/BANDAGES/DRESSINGS) ×1 IMPLANT
CUFF TOURN SGL QUICK 18X4 (TOURNIQUET CUFF) ×1 IMPLANT
DRAPE U-SHAPE 47X51 STRL (DRAPES) ×1 IMPLANT
DURAPREP 26ML APPLICATOR (WOUND CARE) ×1 IMPLANT
ELECT CAUTERY BLADE 6.4 (BLADE) ×1 IMPLANT
ELECT REM PT RETURN 9FT ADLT (ELECTROSURGICAL) ×1
ELECTRODE REM PT RTRN 9FT ADLT (ELECTROSURGICAL) ×1 IMPLANT
GAUZE SPONGE 4X4 12PLY STRL (GAUZE/BANDAGES/DRESSINGS) ×1 IMPLANT
GAUZE XEROFORM 1X8 LF (GAUZE/BANDAGES/DRESSINGS) ×1 IMPLANT
GLOVE BIOGEL M STRL SZ7.5 (GLOVE) ×1 IMPLANT
GLOVE SURG UNDER POLY LF SZ8 (GLOVE) ×2 IMPLANT
GOWN STRL REUS W/ TWL LRG LVL3 (GOWN DISPOSABLE) ×2 IMPLANT
KIT TURNOVER KIT A (KITS) ×1 IMPLANT
MANIFOLD NEPTUNE II (INSTRUMENTS) ×1 IMPLANT
NS IRRIG 500ML POUR BTL (IV SOLUTION) ×1 IMPLANT
PACK EXTREMITY ARMC (MISCELLANEOUS) ×1 IMPLANT
PAD CAST 3X4 CTTN HI CHSV (CAST SUPPLIES) ×1 IMPLANT
SOL PREP PVP 2OZ (MISCELLANEOUS)
SOLUTION PREP PVP 2OZ (MISCELLANEOUS) ×1 IMPLANT
SPLINT CAST 1 STEP 3X12 (MISCELLANEOUS) ×1 IMPLANT
STOCKINETTE 48X4 2 PLY STRL (GAUZE/BANDAGES/DRESSINGS) ×1 IMPLANT
STOCKINETTE STRL 4IN 9604848 (GAUZE/BANDAGES/DRESSINGS) ×1
SUT ETHILON 5-0 FS-2 18 BLK (SUTURE) ×1 IMPLANT
TRAP FLUID SMOKE EVACUATOR (MISCELLANEOUS) ×1 IMPLANT
WATER STERILE IRR 500ML POUR (IV SOLUTION) ×1 IMPLANT

## 2023-10-28 NOTE — Anesthesia Postprocedure Evaluation (Signed)
Anesthesia Post Note  Patient: Monica Tyler  Procedure(s) Performed: CARPAL TUNNEL RELEASE (Right: Wrist)  Patient location during evaluation: PACU Anesthesia Type: General Level of consciousness: awake and alert Pain management: pain level controlled Vital Signs Assessment: post-procedure vital signs reviewed and stable Respiratory status: spontaneous breathing, nonlabored ventilation, respiratory function stable and patient connected to nasal cannula oxygen Cardiovascular status: blood pressure returned to baseline and stable Postop Assessment: no apparent nausea or vomiting Anesthetic complications: no  No notable events documented.   Last Vitals:  Vitals:   10/28/23 1730 10/28/23 1745  BP: (!) 112/59 136/69  Pulse: 64 64  Resp: 14 16  Temp: (!) 36.2 C (!) 36.3 C  SpO2: 97% 100%    Last Pain:  Vitals:   10/28/23 1745  TempSrc: Temporal  PainSc: 0-No pain                 Stephanie Coup

## 2023-10-28 NOTE — Interval H&P Note (Signed)
History and Physical Interval Note:  10/28/2023 3:18 PM  Monica Tyler  has presented today for surgery, with the diagnosis of Carpal tunnel syndrome of right wrist G56.01.  The various methods of treatment have been discussed with the patient and family. After consideration of risks, benefits and other options for treatment, the patient has consented to  Procedure(s): CARPAL TUNNEL RELEASE (Right) as a surgical intervention.  The patient's history has been reviewed, patient examined, no change in status, stable for surgery.  I have reviewed the patient's chart and labs.  Questions were answered to the patient's satisfaction.     Katerina Zurn P Krishon Adkison
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
## 2023-10-28 NOTE — Op Note (Signed)
OPERATIVE NOTE  DATE OF SURGERY:  10/28/2023  PATIENT NAME:  Monica Tyler   DOB: [birth year]  MRN: [id number]  PRE-OPERATIVE DIAGNOSIS: Right carpal tunnel syndrome  POST-OPERATIVE DIAGNOSIS:  Same  PROCEDURE:  Right carpal tunnel release  SURGEON:  Jena Gauss. M.D.  ANESTHESIA: general  ESTIMATED BLOOD LOSS: Minimal  FLUIDS REPLACED: 500 mL of crystalloid  TOURNIQUET TIME: 28 minutes  DRAINS: None  INDICATIONS FOR SURGERY: [name] is a 68 y.o. year old female with a long history of numbness and paresthesias to the right hand consistent with carpal tunnel syndrome.The patient had not seen any significant improvement despite conservative nonsurgical intervention. After discussion of the risks and benefits of surgical intervention, the patient expressed understanding of the risks benefits and agree with plans for carpal tunnel release.   PROCEDURE IN DETAIL: The patient was brought into the operating room and after adequate general anesthesia, a tourniquet was placed on the patient's right upper arm.The right hand and arm were prepped with alcohol and Duraprep and draped in the usual sterile fashion. A "time-out" was performed as per usual protocol. The hand and forearm were exsanguinated using an Esmarch and the tourniquet was inflated to 250 mmHg. Loupe magnification was used throughout the procedure. An incision was made just ulnar to the thenar palmar crease. Dissection was carried down through the palmar fascia to the transverse carpal ligament. The transverse carpal ligament was sharply incised, taking care to protect the underlying structures with the carpal tunnel. Complete release of the transverse carpal ligament was achieved. There was no evidence of ganglion cyst or lipoma within the carpal tunnel. The wound was irrigated with copious amounts of normal saline with antibiotic solution. The skin was then re-approximated with interrupted sutures of #5-0 nylon. A sterile  dressing was applied followed by application of a volar splint. The tourniquet was deflated with a total tourniquet time of 28 minutes.  The patient tolerated the procedure well and was transported to the PACU in stable condition.  Elleni Mozingo P. Angie Fava., M.D.

## 2023-10-28 NOTE — Transfer of Care (Signed)
Immediate Anesthesia Transfer of Care Note  Patient: Monica Tyler  Procedure(s) Performed: CARPAL TUNNEL RELEASE (Right: Wrist)  Patient Location: PACU  Anesthesia Type:General  Level of Consciousness: awake  Airway & Oxygen Therapy: Patient Spontanous Breathing and Patient connected to face mask oxygen  Post-op Assessment: Report given to RN and Post -op Vital signs reviewed and stable  Post vital signs: Reviewed and stable  Last Vitals:  Vitals Value Taken Time  BP 133/65 10/28/23 1700  Temp 36.3 C 10/28/23 1700  Pulse 66 10/28/23 1708  Resp 14 10/28/23 1708  SpO2 99 % 10/28/23 1708  Vitals shown include unfiled device data.  Last Pain:  Vitals:   10/28/23 1700  TempSrc:   PainSc: 0-No pain         Complications: No notable events documented.

## 2023-10-28 NOTE — Anesthesia Procedure Notes (Signed)
Procedure Name: Intubation Date/Time: 10/28/2023 3:55 PM  Performed by: Elisabeth Pigeon, CRNAPre-anesthesia Checklist: Patient identified, Emergency Drugs available, Suction available and Patient being monitored Patient Re-evaluated:Patient Re-evaluated prior to induction Oxygen Delivery Method: Circle system utilized Preoxygenation: Pre-oxygenation with 100% oxygen Induction Type: IV induction Ventilation: Mask ventilation without difficulty Laryngoscope Size: McGrath and 3 Grade View: Grade I Tube type: Oral Tube size: 6.5 mm Number of attempts: 1 Airway Equipment and Method: Stylet and Oral airway Placement Confirmation: ETT inserted through vocal cords under direct vision, positive ETCO2 and breath sounds checked- equal and bilateral Secured at: 21 cm Tube secured with: Tape Dental Injury: Teeth and Oropharynx as per pre-operative assessment  Comments: LMA tried first, unsuccessful. Unable to be seated correctly. Moved to ETT

## 2023-10-29 ENCOUNTER — Encounter: Payer: Self-pay | Admitting: Orthopedic Surgery

## 2023-12-13 ENCOUNTER — Other Ambulatory Visit: Payer: Self-pay | Admitting: Obstetrics and Gynecology

## 2023-12-13 DIAGNOSIS — Z1239 Encounter for other screening for malignant neoplasm of breast: Secondary | ICD-10-CM

## 2023-12-18 ENCOUNTER — Encounter: Payer: Self-pay | Admitting: Obstetrics and Gynecology

## 2024-01-20 ENCOUNTER — Ambulatory Visit
Admission: RE | Admit: 2024-01-20 | Discharge: 2024-01-20 | Disposition: A | Discharge status: Home / Self Care | Source: Ambulatory Visit | Attending: Obstetrics and Gynecology | Admitting: Obstetrics and Gynecology

## 2024-01-20 DIAGNOSIS — Z1239 Encounter for other screening for malignant neoplasm of breast: Secondary | ICD-10-CM

## 2024-01-20 MED ORDER — GADOPICLENOL 0.5 MMOL/ML IV SOLN
10.0000 mL | Freq: Once | INTRAVENOUS | Status: AC | PRN
Start: 2024-01-20 — End: 2024-01-20
  Administered 2024-01-20: 10 mL via INTRAVENOUS

## 2024-04-01 ENCOUNTER — Other Ambulatory Visit: Payer: Self-pay | Admitting: Obstetrics and Gynecology

## 2024-04-01 DIAGNOSIS — Z1231 Encounter for screening mammogram for malignant neoplasm of breast: Secondary | ICD-10-CM

## 2024-07-02 ENCOUNTER — Ambulatory Visit
Admission: RE | Admit: 2024-07-02 | Discharge: 2024-07-02 | Disposition: A | Discharge status: Home / Self Care | Source: Ambulatory Visit | Attending: Obstetrics and Gynecology | Admitting: Obstetrics and Gynecology

## 2024-07-02 DIAGNOSIS — Z1231 Encounter for screening mammogram for malignant neoplasm of breast: Secondary | ICD-10-CM | POA: Insufficient documentation

## 2024-09-08 ENCOUNTER — Encounter: Payer: Self-pay | Admitting: Unknown Physician Specialty

## 2024-09-08 ENCOUNTER — Other Ambulatory Visit: Payer: Self-pay | Admitting: Unknown Physician Specialty

## 2024-09-09 NOTE — Anesthesia Preprocedure Evaluation (Signed)
 Anesthesia Evaluation    Airway        Dental   Pulmonary former smoker          Cardiovascular hypertension,      Neuro/Psych    GI/Hepatic   Endo/Other    Renal/GU      Musculoskeletal   Abdominal   Peds  Hematology   Anesthesia Other Findings Hypertension Asthma Arthritis DDD (degenerative disc disease) UTI (lower urinary tract infection) History of kidney stones Fracture, ankle Fracture of capitate of left wrist Cancer (HCC) Bursitis of left hip Mixed hyperlipidemia Acquired hypothyroidism Pre-diabetes Cellulitis of chest wall Disorder of intervertebral disc of cervical spine GERD (gastroesophageal reflux disea    Reproductive/Obstetrics                              Anesthesia Physical Anesthesia Plan Anesthesia Quick Evaluation

## 2024-09-11 ENCOUNTER — Encounter: Admission: RE | Payer: Self-pay | Source: Home / Self Care

## 2024-09-11 ENCOUNTER — Ambulatory Visit
Admission: RE | Admit: 2024-09-11 | Discharge: 2024-09-11 | Disposition: A | Discharge status: Home / Self Care | Attending: Unknown Physician Specialty | Admitting: Unknown Physician Specialty

## 2024-09-11 ENCOUNTER — Ambulatory Visit: Payer: Self-pay | Admitting: Anesthesiology

## 2024-09-11 ENCOUNTER — Other Ambulatory Visit: Payer: Self-pay

## 2024-09-11 DIAGNOSIS — W449XXA Unspecified foreign body entering into or through a natural orifice, initial encounter: Secondary | ICD-10-CM | POA: Diagnosis not present

## 2024-09-11 DIAGNOSIS — J45909 Unspecified asthma, uncomplicated: Secondary | ICD-10-CM | POA: Insufficient documentation

## 2024-09-11 DIAGNOSIS — I10 Essential (primary) hypertension: Secondary | ICD-10-CM | POA: Diagnosis not present

## 2024-09-11 DIAGNOSIS — K219 Gastro-esophageal reflux disease without esophagitis: Secondary | ICD-10-CM | POA: Insufficient documentation

## 2024-09-11 DIAGNOSIS — T171XXA Foreign body in nostril, initial encounter: Secondary | ICD-10-CM | POA: Diagnosis present

## 2024-09-11 DIAGNOSIS — Z87891 Personal history of nicotine dependence: Secondary | ICD-10-CM | POA: Diagnosis not present

## 2024-09-11 HISTORY — PX: FOREIGN BODY REMOVAL NASAL: SHX5323

## 2024-09-11 HISTORY — PX: NASAL ENDOSCOPY: SHX6577

## 2024-09-11 SURGERY — ENDOSCOPY, NOSE
Anesthesia: General | Site: Nose | Laterality: Right

## 2024-09-11 MED ORDER — FENTANYL CITRATE (PF) 100 MCG/2ML IJ SOLN
INTRAMUSCULAR | Status: AC
  Filled 2024-09-11: qty 2

## 2024-09-11 MED ORDER — SUCCINYLCHOLINE CHLORIDE 200 MG/10ML IV SOSY
PREFILLED_SYRINGE | INTRAVENOUS | Status: DC | PRN
  Administered 2024-09-11: 120 mg via INTRAVENOUS

## 2024-09-11 MED ORDER — PROPOFOL 10 MG/ML IV BOLUS
INTRAVENOUS | Status: DC | PRN
  Administered 2024-09-11: 200 mg via INTRAVENOUS

## 2024-09-11 MED ORDER — ONDANSETRON HCL 4 MG/2ML IJ SOLN
INTRAMUSCULAR | Status: AC
  Filled 2024-09-11: qty 2

## 2024-09-11 MED ORDER — LACTATED RINGERS IV SOLN: INTRAVENOUS | Status: DC

## 2024-09-11 MED ORDER — GLYCOPYRROLATE 0.2 MG/ML IJ SOLN
INTRAMUSCULAR | Status: AC
  Filled 2024-09-11: qty 1

## 2024-09-11 MED ORDER — PROPOFOL 10 MG/ML IV BOLUS
INTRAVENOUS | Status: AC
  Filled 2024-09-11: qty 20

## 2024-09-11 MED ORDER — MIDAZOLAM HCL 5 MG/5ML IJ SOLN
INTRAMUSCULAR | Status: DC | PRN
  Administered 2024-09-11: 2 mg via INTRAVENOUS

## 2024-09-11 MED ORDER — PHENYLEPHRINE HCL 0.5 % NA SOLN
NASAL | Status: DC | PRN
  Administered 2024-09-11: 10 mL via NASAL

## 2024-09-11 MED ORDER — ONDANSETRON HCL 4 MG/2ML IJ SOLN
INTRAMUSCULAR | Status: DC | PRN
  Administered 2024-09-11: 4 mg via INTRAVENOUS

## 2024-09-11 MED ORDER — FENTANYL CITRATE (PF) 100 MCG/2ML IJ SOLN
INTRAMUSCULAR | Status: DC | PRN
  Administered 2024-09-11 (×2): 50 ug via INTRAVENOUS

## 2024-09-11 MED ORDER — MIDAZOLAM HCL 2 MG/2ML IJ SOLN
INTRAMUSCULAR | Status: AC
  Filled 2024-09-11: qty 2

## 2024-09-11 MED ORDER — DEXAMETHASONE SODIUM PHOSPHATE 4 MG/ML IJ SOLN
INTRAMUSCULAR | Status: AC
  Filled 2024-09-11: qty 2

## 2024-09-11 MED ORDER — DEXAMETHASONE SODIUM PHOSPHATE 4 MG/ML IJ SOLN
INTRAMUSCULAR | Status: DC | PRN
  Administered 2024-09-11: 10 mg via INTRAVENOUS

## 2024-09-11 MED ORDER — LIDOCAINE HCL (CARDIAC) PF 100 MG/5ML IV SOSY
PREFILLED_SYRINGE | INTRAVENOUS | Status: DC | PRN
  Administered 2024-09-11: 100 mg via INTRAVENOUS

## 2024-09-11 MED ORDER — LIDOCAINE HCL (PF) 2 % IJ SOLN
INTRAMUSCULAR | Status: AC
  Filled 2024-09-11: qty 5

## 2024-09-11 SURGICAL SUPPLY — 20 items
COAG SUCTION FOOTSWITCH 10FR (SUCTIONS) ×1 IMPLANT
DRAPE HEAD BAR (DRAPES) ×1 IMPLANT
DRSG TELFA 3X4 N-ADH STERILE (GAUZE/BANDAGES/DRESSINGS) IMPLANT
ELECTRODE REM PT RTRN 9FT ADLT (ELECTROSURGICAL) ×1 IMPLANT
GAUZE SPONGE 4X4 12PLY STRL (GAUZE/BANDAGES/DRESSINGS) IMPLANT
GLOVE BIO SURGEON STRL SZ7.5 (GLOVE) ×2 IMPLANT
HANDLE YANKAUER SUCT BULB TIP (MISCELLANEOUS) ×1 IMPLANT
KIT TURNOVER KIT A (KITS) ×1 IMPLANT
NDL HYPO 25GX1X1/2 BEV (NEEDLE) ×1 IMPLANT
NEEDLE HYPO 25GX1X1/2 BEV (NEEDLE) ×1 IMPLANT
PACK ENT CUSTOM (PACKS) ×1 IMPLANT
SOLUTION ANTFG W/FOAM PAD STRL (MISCELLANEOUS) IMPLANT
SPONGE NEURO XRAY DETECT 1X3 (DISPOSABLE) ×1 IMPLANT
STRAP BODY AND KNEE 60X3 (MISCELLANEOUS) ×1 IMPLANT
SUT ETHILON 3-0 KS 30 BLK (SUTURE) ×1 IMPLANT
SUTURE CHRMC 3-0 KS 27XMFL CR (SUTURE) ×1 IMPLANT
SYR 10ML LL (SYRINGE) ×1 IMPLANT
SYR BULB IRRIG 60ML STRL (SYRINGE) IMPLANT
TOWEL OR 17X26 4PK STRL BLUE (TOWEL DISPOSABLE) ×1 IMPLANT
WATER STERILE IRR 250ML POUR (IV SOLUTION) ×1 IMPLANT

## 2024-09-11 NOTE — Op Note (Signed)
 09/11/2024  10:43 AM    Dyana Lew  [id number]   Pre-Op Dx: FOREIGN BODY NOSE  Post-op Dx: SAME  Proc: Endoscopic removal foreign body right nostril  Surg:  Chinita ONEIDA Hasten  Anes:  GOT  EBL: Less than 5 cc  Comp: None  Findings: Firm crusted mass wedged beneath the inferior turbinate on the right  Procedure: Barnie was identified in the holding area taken the operating placed in supine position.  After general and trach anesthesia the tape was turned 90 degrees and the patient was draped in usual fashion for sinus surgery.  Cottonoid pledgets with phenylephrine  lidocaine  solution were placed within each nostril.  After 5 minutes these were removed.  0 degree endoscope was introduced into the left nostril was normal anatomy no foreign body.  On the right-hand side 0 degree endoscope was placed there was a large crusty mass wedged in between the nasal septum and the inferior turbinate.  45 degree forceps were then used to gently extract the mass which broke into several pieces.  The mass was removed in its entirety.  On the back table it appeared that this mass was a gravel type foreign body.  The nose was copiously irrigated with saline.  Phenylephrine  lidocaine  pledget was were placed on the right-hand side 4 minutes later was removed.  The 0 degree endoscope showed no residual foreign body.  There was a divot in the inferior turbinate where the foreign body had been wedged.  With no active bleeding the patient was returned to anesthesia where she was x-rayed in the operating to the cart in stable condition.  Specimen: Right nasal foreign body  Dispo:   Good  Plan: Discharge to home follow-up 3 weeks  Chinita ONEIDA Hasten  09/11/2024 10:43 AM

## 2024-09-11 NOTE — H&P (Signed)
 The patient's history has been reviewed, patient examined, no change in status, stable for surgery.  Questions were answered to the patients satisfaction.

## 2024-09-11 NOTE — Anesthesia Postprocedure Evaluation (Signed)
"   Anesthesia Post Note  Patient: SKILA ROLLINS  Procedure(s) Performed: ENDOSCOPY, NOSE (Right: Nose) REMOVAL, FOREIGN BODY, NOSE (Right: Nose)  Patient location during evaluation: PACU Anesthesia Type: General Level of consciousness: awake and alert Pain management: pain level controlled Vital Signs Assessment: post-procedure vital signs reviewed and stable Respiratory status: spontaneous breathing, nonlabored ventilation, respiratory function stable and patient connected to nasal cannula oxygen Cardiovascular status: blood pressure returned to baseline and stable Postop Assessment: no apparent nausea or vomiting Anesthetic complications: no   No notable events documented.   Last Vitals:  Vitals:   09/11/24 1054 09/11/24 1110  BP: (!) 180/94 135/76  Pulse: 78 82  Resp: 15 14  Temp: 36.6 C 36.4 C  SpO2: 98% 94%    Last Pain:  Vitals:   09/11/24 1110  TempSrc:   PainSc: 0-No pain                 Sheresa Cullop C Mallorie Norrod      "

## 2024-09-11 NOTE — Transfer of Care (Signed)
 Immediate Anesthesia Transfer of Care Note  Patient: Monica Tyler  Procedure(s) Performed: ENDOSCOPY, NOSE (Right: Nose) REMOVAL, FOREIGN BODY, NOSE (Right: Nose)  Patient Location: PACU  Anesthesia Type: General  Level of Consciousness: awake, alert  and patient cooperative  Airway and Oxygen Therapy: Patient Spontanous Breathing and Patient connected to supplemental oxygen  Post-op Assessment: Post-op Vital signs reviewed, Patient's Cardiovascular Status Stable, Respiratory Function Stable, Patent Airway and No signs of Nausea or vomiting  Post-op Vital Signs: Reviewed and stable  Complications: No notable events documented.

## 2024-09-11 NOTE — Anesthesia Procedure Notes (Signed)
 Procedure Name: Intubation Date/Time: 09/11/2024 10:17 AM  Performed by: Dave Maus, CRNAPre-anesthesia Checklist: Patient identified, Emergency Drugs available, Suction available and Patient being monitored Patient Re-evaluated:Patient Re-evaluated prior to induction Oxygen Delivery Method: Circle system utilized Preoxygenation: Pre-oxygenation with 100% oxygen Induction Type: IV induction Ventilation: Mask ventilation without difficulty Laryngoscope Size: McGrath and 3 Grade View: Grade II Tube type: Oral Rae Tube size: 6.5 mm Number of attempts: 2 Airway Equipment and Method: Stylet and Oral airway Placement Confirmation: ETT inserted through vocal cords under direct vision, positive ETCO2 and breath sounds checked- equal and bilateral Tube secured with: Tape Dental Injury: Teeth and Oropharynx as per pre-operative assessment

## 2024-09-15 LAB — SURGICAL PATHOLOGY
